# Patient Record
Sex: Female | Born: 2000 | Race: White | Hispanic: No | Marital: Single | Smoking: Never smoker
Health system: Southern US, Community
[De-identification: ages and names within clinical notes are randomized; demographics above are authoritative.]

## PROBLEM LIST (undated history)

## (undated) DIAGNOSIS — E282 Polycystic ovarian syndrome: Secondary | ICD-10-CM

## (undated) DIAGNOSIS — J45909 Unspecified asthma, uncomplicated: Secondary | ICD-10-CM

## (undated) DIAGNOSIS — R05 Cough: Secondary | ICD-10-CM

## (undated) DIAGNOSIS — E119 Type 2 diabetes mellitus without complications: Secondary | ICD-10-CM

## (undated) DIAGNOSIS — E669 Obesity, unspecified: Secondary | ICD-10-CM

## (undated) DIAGNOSIS — E079 Disorder of thyroid, unspecified: Secondary | ICD-10-CM

## (undated) DIAGNOSIS — K760 Fatty (change of) liver, not elsewhere classified: Secondary | ICD-10-CM

## (undated) DIAGNOSIS — E039 Hypothyroidism, unspecified: Secondary | ICD-10-CM

## (undated) DIAGNOSIS — G4730 Sleep apnea, unspecified: Secondary | ICD-10-CM

## (undated) DIAGNOSIS — F419 Anxiety disorder, unspecified: Secondary | ICD-10-CM

## (undated) DIAGNOSIS — R109 Unspecified abdominal pain: Secondary | ICD-10-CM

## (undated) DIAGNOSIS — R059 Cough, unspecified: Secondary | ICD-10-CM

## (undated) HISTORY — PX: OTHER SURGICAL HISTORY: SHX169

## (undated) HISTORY — PX: WISDOM TOOTH EXTRACTION: SHX21

## (undated) HISTORY — PX: UVULOPALATOPHARYNGOPLASTY: SHX827

---
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
## 2019-01-26 ENCOUNTER — Ambulatory Visit: Payer: Medicaid Other

## 2019-01-31 ENCOUNTER — Ambulatory Visit: Payer: Medicaid Other

## 2019-02-12 ENCOUNTER — Encounter: Payer: Self-pay | Admitting: Emergency Medicine

## 2019-02-12 ENCOUNTER — Other Ambulatory Visit: Payer: Self-pay

## 2019-02-12 ENCOUNTER — Emergency Department
Admission: EM | Admit: 2019-02-12 | Discharge: 2019-02-12 | Disposition: A | Discharge status: Home / Self Care | Payer: Medicaid Other | Attending: Emergency Medicine | Admitting: Emergency Medicine

## 2019-02-12 ENCOUNTER — Emergency Department: Payer: Medicaid Other

## 2019-02-12 DIAGNOSIS — E039 Hypothyroidism, unspecified: Secondary | ICD-10-CM | POA: Insufficient documentation

## 2019-02-12 DIAGNOSIS — Z79899 Other long term (current) drug therapy: Secondary | ICD-10-CM | POA: Diagnosis not present

## 2019-02-12 DIAGNOSIS — N938 Other specified abnormal uterine and vaginal bleeding: Secondary | ICD-10-CM | POA: Diagnosis not present

## 2019-02-12 DIAGNOSIS — E119 Type 2 diabetes mellitus without complications: Secondary | ICD-10-CM | POA: Diagnosis not present

## 2019-02-12 DIAGNOSIS — N939 Abnormal uterine and vaginal bleeding, unspecified: Secondary | ICD-10-CM | POA: Diagnosis present

## 2019-02-12 LAB — COMPREHENSIVE METABOLIC PANEL
ALT: 56 U/L — ABNORMAL HIGH (ref 0–44)
AST: 51 U/L — ABNORMAL HIGH (ref 15–41)
Albumin: 4.4 g/dL (ref 3.5–5.0)
Alkaline Phosphatase: 69 U/L (ref 38–126)
Anion gap: 8 (ref 5–15)
BUN: 7 mg/dL (ref 6–20)
CO2: 25 mmol/L (ref 22–32)
Calcium: 9.1 mg/dL (ref 8.9–10.3)
Chloride: 105 mmol/L (ref 98–111)
Creatinine, Ser: 0.53 mg/dL (ref 0.44–1.00)
GFR calc Af Amer: >60 mL/min (ref >60)
GFR calc non Af Amer: >60 mL/min (ref >60)
Glucose, Bld: 99 mg/dL (ref 70–99)
Potassium: 4 mmol/L (ref 3.5–5.1)
Sodium: 138 mmol/L (ref 135–145)
Total Bilirubin: 0.7 mg/dL (ref 0.3–1.2)
Total Protein: 8 g/dL (ref 6.5–8.1)

## 2019-02-12 LAB — TSH: TSH: 7.017 u[IU]/mL — ABNORMAL HIGH (ref 0.350–4.500)

## 2019-02-12 LAB — CBC
HCT: 42.1 % (ref 36.0–46.0)
Hemoglobin: 13.8 g/dL (ref 12.0–15.0)
MCH: 27.7 pg (ref 26.0–34.0)
MCHC: 32.8 g/dL (ref 30.0–36.0)
MCV: 84.4 fL (ref 80.0–100.0)
Platelets: 366 10*3/uL (ref 150–400)
RBC: 4.99 MIL/uL (ref 3.87–5.11)
RDW: 13.3 % (ref 11.5–15.5)
WBC: 10 10*3/uL (ref 4.0–10.5)
nRBC: 0 % (ref 0.0–0.2)

## 2019-02-12 LAB — POCT PREGNANCY, URINE: Preg Test, Ur: NEGATIVE

## 2019-02-12 LAB — PREGNANCY, URINE: Preg Test, Ur: NEGATIVE

## 2019-02-12 LAB — SAMPLE TO BLOOD BANK

## 2019-02-12 NOTE — Discharge Instructions (Addendum)
Please follow-up with your doctor at [facility] or see GYN here.  Dr. Leafy Ro is on for gynecology here.  I have given you her contact information.  Please make sure you are taking very thyroid medication.  Your thyroid level is low and that can predispose you to this heavy bleeding.  Please return if you get weak or lightheaded or begin soaking a pad an hour.

## 2019-02-12 NOTE — ED Notes (Signed)
Patient transported to Ultrasound 

## 2019-02-12 NOTE — ED Provider Notes (Signed)
[facility] Emergency Department Provider Note  ____________________________________________   First MD Initiated Contact with Patient 02/12/19 1715     (approximate)  I have reviewed the triage vital signs and the nursing notes.   HISTORY  Chief Complaint Vaginal Bleeding    HPI Connie Romero is a 18 y.o. female patient reports irregular periods and polycystic ovarian disease.  She has had constant bleeding although low-grade since June 1.  Additionally she reports abdominal pain for about a year.  It was thought it would go away after she had her gallbladder taken out but it really has not.  Patient is supposed to be taking thyroid replacement therapy but stopped.  She just recently got a refill but has not started the thyroid replacement again.  Patient has not seen her doctor for a year.         Past Medical History:  Diagnosis Date   Abdominal pain    Anxiety    Asthma    Cough    Diabetes mellitus without complication (HCC)    Fatty liver    Hypothyroid    Obesity    Sleep apnea    Thyroid disease     There are no active problems to display for this patient.   Past Surgical History:  Procedure Laterality Date   CHOLECYSTECTOMY N/A 10/06/2018   Procedure: LAPAROSCOPIC CHOLECYSTECTOMY;  Surgeon: Carolan Shiverintron-Diaz, Edgardo, MD;  Location: [facility] ORS;  Service: General;  Laterality: N/A;   hypothyroidisn     pcos     UVULOPALATOPHARYNGOPLASTY     WISDOM TOOTH EXTRACTION      Prior to Admission medications   Medication Sig Start Date End Date Taking? Authorizing Provider  levothyroxine (SYNTHROID, LEVOTHROID) 175 MCG tablet Take 175 mcg by mouth daily before breakfast. 04/13/18  Yes [provider]  ondansetron (ZOFRAN ODT) 4 MG disintegrating tablet Take 1 tablet (4 mg total) by mouth every 8 (eight) hours as needed for nausea or vomiting. Patient not taking: Reported on 02/12/2019 09/27/18   Irean HongSung, Jade J, MD     Allergies Shellfish-derived products  No family history on file.  Social History Social History   Tobacco Use   Smoking status: Never Smoker   Smokeless tobacco: Never Used  Substance Use Topics   Alcohol use: No   Drug use: No    Review of systems Constitutional: No fever Eyes: No visual changes. ENT: No sore throat. Cardiovascular: Denies chest pain. Respiratory: Denies shortness of breath. Gastrointestinal: Lower abdominal pain.  No nausea, no vomiting.  No diarrhea.  No constipation. Genitourinary: Negative for dysuria. Musculoskeletal: Negative for back pain. Skin: Negative for rash. Neurological: Negative for headaches, focal weakness   ____________________________________________   PHYSICAL EXAM:  VITAL SIGNS: ED Triage Vitals  Enc Vitals Group     BP 02/12/19 1532 124/69     Pulse Rate 02/12/19 1532 93     Resp 02/12/19 1532 16     Temp 02/12/19 1532 99.4 F (37.4 C)     Temp Source 02/12/19 1532 Oral     SpO2 02/12/19 1532 97 %     Weight 02/12/19 1548 260 lb (117.9 kg)     Height 02/12/19 1548 5\' 5"  (1.651 m)     Head Circumference --      Peak Flow --      Pain Score 02/12/19 1547 0     Pain Loc --      Pain Edu? --      Excl. in  GC? --     Constitutional: Alert and oriented. Well appearing and in no acute distress. Eyes: Conjunctivae are normal.  Head: Atraumatic. Nose: No congestion/rhinnorhea. Mouth/Throat: Mucous membranes are moist.  Oropharynx non-erythematous. Neck: No stridor.   Cardiovascular: Normal rate, regular rhythm. Grossly normal heart sounds.  Good peripheral circulation. Respiratory: Normal respiratory effort.  No retractions. Lungs CTAB. Gastrointestinal: Soft mildly tender in the lower abdomen left lower quadrant.  Patient reports this is been present for about a year.. No distention. No abdominal bruits. No CVA tenderness. Musculoskeletal: No lower extremity tenderness nor edema.  Neurologic:  Normal speech and  language. No gross focal neurologic deficits are appreciated. Skin:  Skin is warm, dry and intact. No rash noted.   ____________________________________________   LABS (all labs ordered are listed, but only abnormal results are displayed)  Labs Reviewed  COMPREHENSIVE METABOLIC PANEL - Abnormal; Notable for the following components:      Result Value   AST 51 (*)    ALT 56 (*)    All other components within normal limits  TSH - Abnormal; Notable for the following components:   TSH 7.017 (*)    All other components within normal limits  CBC  PREGNANCY, URINE  POC URINE PREG, ED  POCT PREGNANCY, URINE  SAMPLE TO BLOOD BANK   ____________________________________________  EKG   ____________________________________________  RADIOLOGY  ED MD interpretation:    Official radiology report(s): US Pelvic Complete With Transvaginal  Result Date: 02/12/2019 CLINICAL DATA:  Vaginal bleeding for the past month and abdominal pain for the past year. EXAM: TRANSABDOMINAL AND TRANSVAGINAL ULTRASOUND OF PELVIS TECHNIQUE: Both transabdominal and transvaginal ultrasound examinations of the pelvis were performed. Transabdominal technique was performed for global imaging of the pelvis including uterus, ovaries, adnexal regions, and pelvic cul-de-sac. It was necessary to proceed with endovaginal exam following the transabdominal exam to visualize the left ovary and better visualize the uterus and right ovary, limited transabdominally by body habitus. COMPARISON:  Abdomen and pelvis CT dated 10/11/2018. FINDINGS: Uterus Measurements: 7.0 x 3.4 x 3.1 cm = volume: 39 mL. No fibroids or other mass visualized. Endometrium Thickness: 6.7 mm.  No focal abnormality visualized. Right ovary Measurements: 3.1 x 2.2 x 1.9 cm = volume: 6.8 mL. Normal appearance/no adnexal mass. Left ovary Measurements: 0.9 x 2.5 x 1.9 cm = volume: 7.3 mL. Normal appearance/no adnexal mass. Other findings No abnormal free fluid.  IMPRESSION: Normal examination. Electronically Signed   By: Claudie Revering M.D.   On: 02/12/2019 20:17    ____________________________________________   PROCEDURES  Procedure(s) performed (including Critical Care):  Procedures   ____________________________________________   INITIAL IMPRESSION / ASSESSMENT AND PLAN / ED COURSE  Radiology has not said there is anything abnormal about her ultrasound.  Patient is hypothyroid and this could easily be causing her low grade chronic bleeding which is not made her anemic.  I will have her follow-up with her doctor at [facility] or GYN here.  Patient was not enthusiastic about my doing a pelvic exam.              ____________________________________________   FINAL CLINICAL IMPRESSION(S) / ED DIAGNOSES  Final diagnoses:  Abnormal uterine bleeding (AUB)     ED Discharge Orders    None       Note:  This document was prepared using Dragon voice recognition software and may include unintentional dictation errors.    Nena Polio, MD 02/12/19 2101

## 2019-02-12 NOTE — ED Triage Notes (Signed)
Pt arrived via POV with reports of irregular vaginal bleeding for several months. Pt states she had 2 periods in June and states she continues to have vaginal bleeding since mid-June.  Pt reports soaking through pads about 2x per day. Pt states last night she bled through pad overnight.  Pt reports some abdominal cramping as well.  Pt not currently on any OCPs or other hormone therapy.

## 2019-02-12 NOTE — ED Notes (Addendum)
Urine sent to lab and type and screen sent to lab with save labels

## 2019-03-08 ENCOUNTER — Ambulatory Visit (LOCAL_COMMUNITY_HEALTH_CENTER): Payer: Self-pay

## 2019-03-08 ENCOUNTER — Other Ambulatory Visit: Payer: Self-pay

## 2019-03-08 VITALS — BP 120/81 | Ht 65.0 in | Wt 267.8 lb

## 2019-03-08 DIAGNOSIS — Z3009 Encounter for other general counseling and advice on contraception: Secondary | ICD-10-CM

## 2019-03-08 DIAGNOSIS — Z30013 Encounter for initial prescription of injectable contraceptive: Secondary | ICD-10-CM

## 2019-03-08 DIAGNOSIS — Z113 Encounter for screening for infections with a predominantly sexual mode of transmission: Secondary | ICD-10-CM

## 2019-03-08 MED ORDER — MEDROXYPROGESTERONE ACETATE 150 MG/ML IM SUSP
150.0000 mg | Freq: Once | INTRAMUSCULAR | Status: AC
  Administered 2019-03-08: 150 mg via INTRAMUSCULAR

## 2019-03-08 NOTE — Progress Notes (Signed)
   Ray problem visit  [facility] Department  Subjective:  Connie Romero is a 18 y.o. being seen today for   Chief Complaint  Patient presents with  . Contraception    Depo  . SEXUALLY TRANSMITTED DISEASE    STD testing    HPI Client states that partner has a disch. States that he has an appt to be evaluated this week.  She denies STD sympts.  She was treated for chlamydia 07/2018.  LMP 03/01/2019.  Last sex was 02/26/2019 without a condom.   Does the patient have a current or past history of drug use? No   No components found for: HCV]   Health Maintenance Due  Topic Date Due  . HIV Screening  11/13/2014  . TETANUS/TDAP  11/13/2018  . INFLUENZA VACCINE  02/26/2019    Review of Systems  All other systems reviewed and are negative.   The following portions of the patient's history were reviewed and updated as appropriate: allergies, current medications, past family history, past medical history, past social history, past surgical history and problem list. Problem list updated.   See flowsheet for other program required questions.  Objective:   Vitals:   03/08/19 1443  BP: 120/81  Weight: 267 lb 12.8 oz (121.5 kg)  Height: 5\' 5"  (1.651 m)    Physical Exam client declines physical exam. Self collect GC/CT and wet prep  Assessment and Plan:  Connie Romero is a 18 y.o. female presenting to the [facility] Department for a Women's Health problem visit  1. Screening examination for venereal disease - WET PREP FOR TRICH, YEAST, CLUE - Chlamydia/Gonorrhea [place] [facility] Treat wet prep as per SO. Wet prep negative    No follow-ups on file.  No future appointments.  Hassell Done, FNP

## 2019-03-08 NOTE — Progress Notes (Signed)
Here today to restart Depo (28.2 weeks post Depo) and STD testing. Hal Morales, RN Wet prep results reviewed. Per standing order no treatment indicated. Depo given and tolerated well. Hal Morales, RN

## 2019-03-09 ENCOUNTER — Ambulatory Visit: Payer: Medicaid Other

## 2019-03-09 LAB — WET PREP FOR TRICH, YEAST, CLUE
Trichomonas Exam: NEGATIVE
Yeast Exam: NEGATIVE

## 2019-03-16 ENCOUNTER — Telehealth: Payer: Self-pay

## 2019-03-16 NOTE — Telephone Encounter (Signed)
TC to patient. Verified ID via password/SS#. Informed of positive Chlaymdia and need for tx. Instructed to eat before visit and have partner call for tx appt. Appt scheduled. Aileen Fass, RN   Lab report in nurse clinic

## 2019-03-17 ENCOUNTER — Ambulatory Visit: Payer: Self-pay

## 2019-03-17 ENCOUNTER — Other Ambulatory Visit: Payer: Self-pay

## 2019-03-17 DIAGNOSIS — A749 Chlamydial infection, unspecified: Secondary | ICD-10-CM

## 2019-03-17 MED ORDER — AZITHROMYCIN 500 MG PO TABS
1000.0000 mg | ORAL_TABLET | Freq: Once | ORAL | Status: AC
  Administered 2019-03-17: 1000 mg via ORAL

## 2019-03-25 ENCOUNTER — Encounter: Payer: Self-pay | Admitting: Emergency Medicine

## 2019-03-25 ENCOUNTER — Emergency Department
Admission: EM | Admit: 2019-03-25 | Discharge: 2019-03-25 | Disposition: A | Discharge status: Home / Self Care | Payer: Medicaid Other | Attending: Emergency Medicine | Admitting: Emergency Medicine

## 2019-03-25 ENCOUNTER — Other Ambulatory Visit: Payer: Self-pay

## 2019-03-25 DIAGNOSIS — Z5321 Procedure and treatment not carried out due to patient leaving prior to being seen by health care provider: Secondary | ICD-10-CM | POA: Diagnosis not present

## 2019-03-25 DIAGNOSIS — R109 Unspecified abdominal pain: Secondary | ICD-10-CM | POA: Insufficient documentation

## 2019-03-25 LAB — COMPREHENSIVE METABOLIC PANEL
ALT: 26 U/L (ref 0–44)
AST: 22 U/L (ref 15–41)
Albumin: 4.3 g/dL (ref 3.5–5.0)
Alkaline Phosphatase: 67 U/L (ref 38–126)
Anion gap: 10 (ref 5–15)
BUN: 8 mg/dL (ref 6–20)
CO2: 23 mmol/L (ref 22–32)
Calcium: 9.1 mg/dL (ref 8.9–10.3)
Chloride: 106 mmol/L (ref 98–111)
Creatinine, Ser: 0.64 mg/dL (ref 0.44–1.00)
GFR calc Af Amer: >60 mL/min (ref >60)
GFR calc non Af Amer: >60 mL/min (ref >60)
Glucose, Bld: 98 mg/dL (ref 70–99)
Potassium: 3.5 mmol/L (ref 3.5–5.1)
Sodium: 139 mmol/L (ref 135–145)
Total Bilirubin: 0.6 mg/dL (ref 0.3–1.2)
Total Protein: 8 g/dL (ref 6.5–8.1)

## 2019-03-25 LAB — URINALYSIS, COMPLETE (UACMP) WITH MICROSCOPIC
Bacteria, UA: NONE SEEN
Bilirubin Urine: NEGATIVE
Glucose, UA: NEGATIVE mg/dL
Hgb urine dipstick: NEGATIVE
Ketones, ur: NEGATIVE mg/dL
Nitrite: NEGATIVE
Protein, ur: 30 mg/dL — AB
Specific Gravity, Urine: 1.028 (ref 1.005–1.030)
pH: 5 (ref 5.0–8.0)

## 2019-03-25 LAB — CBC
HCT: 40.6 % (ref 36.0–46.0)
Hemoglobin: 13.2 g/dL (ref 12.0–15.0)
MCH: 27.5 pg (ref 26.0–34.0)
MCHC: 32.5 g/dL (ref 30.0–36.0)
MCV: 84.6 fL (ref 80.0–100.0)
Platelets: 346 10*3/uL (ref 150–400)
RBC: 4.8 MIL/uL (ref 3.87–5.11)
RDW: 13.2 % (ref 11.5–15.5)
WBC: 11.2 10*3/uL — ABNORMAL HIGH (ref 4.0–10.5)
nRBC: 0 % (ref 0.0–0.2)

## 2019-03-25 LAB — POCT PREGNANCY, URINE: Preg Test, Ur: NEGATIVE

## 2019-03-25 LAB — LIPASE, BLOOD: Lipase: 24 U/L (ref 11–51)

## 2019-03-25 NOTE — ED Triage Notes (Signed)
Patient states generalized abdominal pain for the last few weeks. States pain gets worse when she eats. Patient had cholecystectomy in March and states that she is still having continued pain. States she was told by GI that if she continued to have pain she would need a endoscopy.

## 2019-03-30 NOTE — Addendum Note (Signed)
Addended by: Hassell Done on: 03/30/2019 08:28 AM   Modules accepted: Orders

## 2019-04-18 ENCOUNTER — Emergency Department: Payer: Medicaid Other

## 2019-04-18 ENCOUNTER — Other Ambulatory Visit: Payer: Self-pay

## 2019-04-18 ENCOUNTER — Encounter: Payer: Self-pay | Admitting: *Deleted

## 2019-04-18 ENCOUNTER — Emergency Department
Admission: EM | Admit: 2019-04-18 | Discharge: 2019-04-19 | Disposition: A | Discharge status: Home / Self Care | Payer: Medicaid Other | Attending: Emergency Medicine | Admitting: Emergency Medicine

## 2019-04-18 DIAGNOSIS — E282 Polycystic ovarian syndrome: Secondary | ICD-10-CM

## 2019-04-18 DIAGNOSIS — E039 Hypothyroidism, unspecified: Secondary | ICD-10-CM | POA: Diagnosis not present

## 2019-04-18 DIAGNOSIS — E119 Type 2 diabetes mellitus without complications: Secondary | ICD-10-CM | POA: Diagnosis not present

## 2019-04-18 DIAGNOSIS — R1030 Lower abdominal pain, unspecified: Secondary | ICD-10-CM

## 2019-04-18 DIAGNOSIS — Z79899 Other long term (current) drug therapy: Secondary | ICD-10-CM | POA: Diagnosis not present

## 2019-04-18 DIAGNOSIS — R102 Pelvic and perineal pain: Secondary | ICD-10-CM | POA: Insufficient documentation

## 2019-04-18 DIAGNOSIS — R1032 Left lower quadrant pain: Secondary | ICD-10-CM | POA: Diagnosis present

## 2019-04-18 LAB — URINALYSIS, COMPLETE (UACMP) WITH MICROSCOPIC
Bilirubin Urine: NEGATIVE
Glucose, UA: NEGATIVE mg/dL
Hgb urine dipstick: NEGATIVE
Ketones, ur: NEGATIVE mg/dL
Nitrite: NEGATIVE
Protein, ur: NEGATIVE mg/dL
Specific Gravity, Urine: 1.023 (ref 1.005–1.030)
pH: 5 (ref 5.0–8.0)

## 2019-04-18 LAB — WET PREP, GENITAL
Clue Cells Wet Prep HPF POC: NONE SEEN
Sperm: NONE SEEN
Trich, Wet Prep: NONE SEEN
Yeast Wet Prep HPF POC: NONE SEEN

## 2019-04-18 LAB — CBC
HCT: 42.7 % (ref 36.0–46.0)
Hemoglobin: 13.8 g/dL (ref 12.0–15.0)
MCH: 27.3 pg (ref 26.0–34.0)
MCHC: 32.3 g/dL (ref 30.0–36.0)
MCV: 84.6 fL (ref 80.0–100.0)
Platelets: 353 10*3/uL (ref 150–400)
RBC: 5.05 MIL/uL (ref 3.87–5.11)
RDW: 13.1 % (ref 11.5–15.5)
WBC: 13.6 10*3/uL — ABNORMAL HIGH (ref 4.0–10.5)
nRBC: 0 % (ref 0.0–0.2)

## 2019-04-18 LAB — COMPREHENSIVE METABOLIC PANEL
ALT: 23 U/L (ref 0–44)
AST: 18 U/L (ref 15–41)
Albumin: 4.1 g/dL (ref 3.5–5.0)
Alkaline Phosphatase: 61 U/L (ref 38–126)
Anion gap: 11 (ref 5–15)
BUN: 8 mg/dL (ref 6–20)
CO2: 22 mmol/L (ref 22–32)
Calcium: 9 mg/dL (ref 8.9–10.3)
Chloride: 107 mmol/L (ref 98–111)
Creatinine, Ser: 0.71 mg/dL (ref 0.44–1.00)
GFR calc Af Amer: >60 mL/min (ref >60)
GFR calc non Af Amer: >60 mL/min (ref >60)
Glucose, Bld: 83 mg/dL (ref 70–99)
Potassium: 4 mmol/L (ref 3.5–5.1)
Sodium: 140 mmol/L (ref 135–145)
Total Bilirubin: 0.5 mg/dL (ref 0.3–1.2)
Total Protein: 7.7 g/dL (ref 6.5–8.1)

## 2019-04-18 LAB — POCT PREGNANCY, URINE: Preg Test, Ur: NEGATIVE

## 2019-04-18 LAB — LIPASE, BLOOD: Lipase: 21 U/L (ref 11–51)

## 2019-04-18 MED ORDER — SODIUM CHLORIDE 0.9% FLUSH: 3.0000 mL | Freq: Once | INTRAVENOUS | Status: DC

## 2019-04-18 NOTE — ED Provider Notes (Signed)
-----------------------------------------   11:10 PM on 04/18/2019 -----------------------------------------  Assuming care from Dr. Charna Archer.  In short, Connie Romero is a 18 y.o. female with a chief complaint of RLQ pain.  Refer to the original H&P for additional details.  The current plan of care is to follow up CT abd/pelvis and reassess.  Anticipate discharge if CT does not reveal appendicitis or other emergent cause of abdominal/pelvic pain.   ----------------------------------------- 1:18 AM on 04/19/2019 -----------------------------------------  CT scan reassuring with no evidence of acute or emergent medical condition.  I will update patient and encouraged her to follow-up with OB/GYN for the PCOS found on ultrasound.   Hinda Kehr, MD 04/19/19 [phone]

## 2019-04-18 NOTE — ED Provider Notes (Signed)
[facility] Emergency Department Provider Note  Time seen: 8:36 PM  I have reviewed the triage vital signs and the nursing notes.   HISTORY  Chief Complaint Abdominal Pain   HPI Connie Romero is a 18 y.o. female with a past medical history abdominal pain, anxiety, diabetes, PCO S, presents to the emergency department for lower abdominal pain.   According to the patient beginning this morning she has developed left lower quadrant abdominal pain that is now progressed to the right lower quadrant.  Denies any dysuria or hematuria.  Denies any vaginal bleeding or discharge.  Has never had abdominal surgery per patient.  Denies any fever cough congestion or shortness of breath.  Past Medical History:  Diagnosis Date  . Abdominal pain   . Anxiety   . Asthma   . Cough   . Diabetes mellitus without complication (HCC)   . Fatty liver   . Hypothyroid   . Obesity   . Sleep apnea   . Thyroid disease     There are no active problems to display for this patient.   Past Surgical History:  Procedure Laterality Date  . CHOLECYSTECTOMY N/A 10/06/2018   Procedure: LAPAROSCOPIC CHOLECYSTECTOMY;  Surgeon: Carolan Shiverintron-Diaz, Edgardo, MD;  Location: [facility] ORS;  Service: General;  Laterality: N/A;  . hypothyroidisn    . pcos    . UVULOPALATOPHARYNGOPLASTY    . WISDOM TOOTH EXTRACTION      Prior to Admission medications   Medication Sig Start Date End Date Taking? Authorizing Provider  levothyroxine (SYNTHROID, LEVOTHROID) 175 MCG tablet Take 175 mcg by mouth daily before breakfast. 04/13/18   [provider]  ondansetron (ZOFRAN ODT) 4 MG disintegrating tablet Take 1 tablet (4 mg total) by mouth every 8 (eight) hours as needed for nausea or vomiting. Patient not taking: Reported on 02/12/2019 09/27/18   Irean HongSung, Jade J, MD    Allergies  Allergen Reactions  . Shellfish-Derived Products Nausea And Vomiting    Family History  Problem Relation Age of Onset  . Diabetes  Maternal Grandmother   . Hypertension Maternal Grandmother   . Kidney disease Maternal Grandmother   . Lung cancer Maternal Grandmother   . Pulmonary embolism Maternal Grandmother     Social History Social History   Tobacco Use  . Smoking status: Never Smoker  . Smokeless tobacco: Never Used  Substance Use Topics  . Alcohol use: No  . Drug use: No    Review of Systems Constitutional: Negative for fever. Cardiovascular: Negative for chest pain. Respiratory: Negative for shortness of breath. Gastrointestinal: Lower abdominal pain. Musculoskeletal: Negative for musculoskeletal complaints Neurological: Negative for headache All other ROS negative  ____________________________________________   PHYSICAL EXAM:  VITAL SIGNS: ED Triage Vitals  Enc Vitals Group     BP 04/18/19 1910 118/80     Pulse Rate 04/18/19 1910 69     Resp 04/18/19 1910 20     Temp 04/18/19 1910 99.1 F (37.3 C)     Temp Source 04/18/19 1910 Oral     SpO2 04/18/19 1910 99 %     Weight --      Height --      Head Circumference --      Peak Flow --      Pain Score 04/18/19 1907 9     Pain Loc --      Pain Edu? --      Excl. in GC? --     Constitutional: Alert and oriented. Well appearing and  in no distress. Eyes: Normal exam ENT      Head: Normocephalic and atraumatic      Mouth/Throat: Mucous membranes are moist. Cardiovascular: Normal rate, regular rhythm.  Respiratory: Normal respiratory effort without tachypnea nor retractions. Breath sounds are clear  Gastrointestinal: Soft, moderate lower abdominal pain across the entire lower abdomen, more so in the right lower quadrant.  Pain appears to be more so in the very low right lower quadrant/right pelvis. Musculoskeletal: Nontender with normal range of motion in all extremities. Neurologic:  Normal speech and language. No gross focal neurologic deficits Skin:  Skin is warm, dry and intact.  Psychiatric: Mood and affect are normal.    ____________________________________________    RADIOLOGY  Ultrasound pending  ____________________________________________   INITIAL IMPRESSION / ASSESSMENT AND PLAN / ED COURSE  Pertinent labs & imaging results that were available during my care of the patient were reviewed by me and considered in my medical decision making (see chart for details).   Patient presents emergency department for lower abdominal pain initially left lower quadrant now right lower quadrant.  Differential would include ovarian cyst, hemorrhagic cyst, UTI, less likely appendicitis although this remains on the differential.  Pelvic exam performed by myself shows a mild amount of discharge, no obvious cervicitis or significant tenderness.  We will proceed with an ultrasound to further evaluate.  If ultrasound shows a cause for the discomfort we will treat said because, if ultrasound is negative I would anticipate proceeding with CT imaging of the abdomen to rule out appendicitis.  Patient's work-up does show white blood cell count currently of 13,000, patient has had a history of elevated white blood cell counts in the past as well.  Patient care signed out to oncoming physician.   [name] was evaluated in Emergency Department on 04/18/2019 for the symptoms described in the history of present illness. She was evaluated in the context of the global COVID-19 pandemic, which necessitated consideration that the patient might be at risk for infection with the SARS-CoV-2 virus that causes COVID-19. Institutional protocols and algorithms that pertain to the evaluation of patients at risk for COVID-19 are in a state of rapid change based on information released by regulatory bodies including the CDC and federal and state organizations. These policies and algorithms were followed during the patient's care in the ED.  ____________________________________________   FINAL CLINICAL IMPRESSION(S) / ED DIAGNOSES  Lower  abdominal pain   Harvest Dark, MD 04/18/19 2043

## 2019-04-18 NOTE — ED Notes (Signed)
Patient transported to Ultrasound 

## 2019-04-18 NOTE — ED Triage Notes (Signed)
Pt has left lower abd pain and dysuria.  Sx began today.  Pt also has lower back pain.  No vag discharge.  No vag bleeding. Pt alert  Speech clear.

## 2019-04-18 NOTE — ED Provider Notes (Signed)
-----------------------------------------   9:15 PM on 04/18/2019 -----------------------------------------  Blood pressure 118/80, pulse 69, temperature 99.1 F (37.3 C), temperature source Oral, resp. rate 20, SpO2 99 %.  Assuming care from Dr. Kerman Passey.  In short, Connie Romero is a 18 y.o. female with a chief complaint of Abdominal Pain .  Refer to the original H&P for additional details.  The current plan of care is to follow-up pelvic US results, however if this does not explain patient's symptoms, plan for CT abdomen/pelvis to rule out appendicitis.  Ultrasound shows evidence of PCOS, which could explain patient's symptoms however she states she has never had pain like this before and continues to have right lower quadrant pain.  Will perform CT abdomen/pelvis to assess for appendicitis.  Patient turned over to Dr. Karma Greaser pending results.    Blake Divine, MD 04/18/19 [phone]

## 2019-04-19 ENCOUNTER — Emergency Department: Payer: Medicaid Other

## 2019-04-19 ENCOUNTER — Encounter: Payer: Self-pay | Admitting: Radiology

## 2019-04-19 MED ORDER — IOHEXOL 300 MG/ML  SOLN
125.0000 mL | Freq: Once | INTRAMUSCULAR | Status: AC | PRN
  Administered 2019-04-19: 01:00:00 125 mL via INTRAVENOUS
  Filled 2019-04-19: qty 125

## 2019-04-19 NOTE — Discharge Instructions (Addendum)
Your workup was reassuring without any signs of infection.  Your symptoms are most likely caused by ovarian cysts likely due to a condition called Polycystic Ovarian Syndrome.  Please read through the included information, take over-the-counter ibuprofen and Tylenol according to the label instructions, and follow up with your regular OB/GYN.  Return to the Emergency Department with new or worsening symptoms that concern you.

## 2019-04-19 NOTE — ED Notes (Signed)
Patient transported to CT 

## 2019-04-21 LAB — GC/CHLAMYDIA PROBE AMP
Chlamydia trachomatis, NAA: NEGATIVE
Neisseria Gonorrhoeae by PCR: NEGATIVE

## 2019-05-03 ENCOUNTER — Other Ambulatory Visit: Payer: Self-pay

## 2019-05-03 DIAGNOSIS — Z20822 Contact with and (suspected) exposure to covid-19: Secondary | ICD-10-CM

## 2019-05-05 LAB — NOVEL CORONAVIRUS, NAA: SARS-CoV-2, NAA: NOT DETECTED

## 2019-05-17 ENCOUNTER — Ambulatory Visit: Payer: Self-pay

## 2019-05-23 ENCOUNTER — Other Ambulatory Visit: Payer: Self-pay

## 2019-05-23 DIAGNOSIS — Z20822 Contact with and (suspected) exposure to covid-19: Secondary | ICD-10-CM

## 2019-05-24 ENCOUNTER — Telehealth: Payer: Self-pay

## 2019-05-24 LAB — NOVEL CORONAVIRUS, NAA: SARS-CoV-2, NAA: DETECTED — AB

## 2019-05-24 NOTE — Telephone Encounter (Signed)
Received call from patient checking Covid results.  Advised results positive.  Advised to contact PCP or visit ED if symptoms worsen.

## 2019-06-13 ENCOUNTER — Ambulatory Visit: Payer: Medicaid Other

## 2019-06-22 ENCOUNTER — Ambulatory Visit: Payer: Medicaid Other

## 2019-06-28 ENCOUNTER — Encounter: Payer: Self-pay | Admitting: Family Medicine

## 2019-06-28 ENCOUNTER — Ambulatory Visit (LOCAL_COMMUNITY_HEALTH_CENTER): Payer: Medicaid Other | Admitting: Family Medicine

## 2019-06-28 ENCOUNTER — Other Ambulatory Visit: Payer: Self-pay

## 2019-06-28 VITALS — BP 117/70 | Ht 66.0 in | Wt 276.0 lb

## 2019-06-28 DIAGNOSIS — Z3009 Encounter for other general counseling and advice on contraception: Secondary | ICD-10-CM | POA: Diagnosis not present

## 2019-06-28 DIAGNOSIS — Z30013 Encounter for initial prescription of injectable contraceptive: Secondary | ICD-10-CM

## 2019-06-28 DIAGNOSIS — Z3042 Encounter for surveillance of injectable contraceptive: Secondary | ICD-10-CM

## 2019-06-28 MED ORDER — MEDROXYPROGESTERONE ACETATE 150 MG/ML IM SUSP
150.0000 mg | Freq: Once | INTRAMUSCULAR | Status: AC
  Administered 2019-06-28: 150 mg via INTRAMUSCULAR

## 2019-06-28 NOTE — Progress Notes (Signed)
Pt here for Depo. Pt was here 03/08/2019 and was restarted on Depo. Pt is 16 weeks and 0 days post last Depo. Pt denies any issues with Depo.Ronny Bacon, RN

## 2019-06-28 NOTE — Progress Notes (Signed)
   [place] problem visit  [facility] Department  Subjective:  Connie Romero is a 18 y.o. being seen today for   Chief Complaint  Patient presents with  . Contraception    Depo    HPI  Client is here for her Depo.  States she had Covid-19 and was unable to make her last depo appointment.  She states that she is not sexually active.     Does the patient have a current or past history of drug use? No   No components found for: HCV]   Health Maintenance Due  Topic Date Due  . TETANUS/TDAP  11/13/2018  . INFLUENZA VACCINE  02/26/2019    ROS  The following portions of the patient's history were reviewed and updated as appropriate: allergies, current medications, past family history, past medical history, past social history, past surgical history and problem list. Problem list updated.   See flowsheet for other program required questions.  Objective:   Vitals:   06/28/19 1323  BP: 117/70  Weight: 276 lb (125.2 kg)  Height: 5\' 6"  (1.676 m)    Physical Exam not indicated    Assessment and Plan:  Connie Romero is a 18 y.o. female presenting to the [facility] Department for a Women's Health problem visit  1. General counseling and advice on contraceptive management  2. Surveillance for Depo-Provera contraception  - medroxyPROGESTERone (DEPO-PROVERA) injection 150 mg x 1 Co. Client to make her annual appt when her next Depo is due.  Client states she understands.    No follow-ups on file.  No future appointments.  Hassell Done, FNP

## 2019-06-28 NOTE — Progress Notes (Signed)
Pt received Depo 150mg  IM today per provider order and pt tolerated well. Counseled pt per provider orders and pt states understanding. Provider orders completed.Ronny Bacon, RN

## 2019-07-12 ENCOUNTER — Encounter: Payer: Self-pay | Admitting: *Deleted

## 2019-07-12 ENCOUNTER — Other Ambulatory Visit: Payer: Self-pay

## 2019-07-12 ENCOUNTER — Emergency Department
Admission: EM | Admit: 2019-07-12 | Discharge: 2019-07-13 | Disposition: A | Discharge status: Home / Self Care | Payer: Medicaid Other | Attending: Emergency Medicine | Admitting: Emergency Medicine

## 2019-07-12 ENCOUNTER — Emergency Department: Payer: Medicaid Other

## 2019-07-12 DIAGNOSIS — J45909 Unspecified asthma, uncomplicated: Secondary | ICD-10-CM | POA: Diagnosis not present

## 2019-07-12 DIAGNOSIS — R1310 Dysphagia, unspecified: Secondary | ICD-10-CM | POA: Insufficient documentation

## 2019-07-12 DIAGNOSIS — Z79899 Other long term (current) drug therapy: Secondary | ICD-10-CM | POA: Diagnosis not present

## 2019-07-12 DIAGNOSIS — E119 Type 2 diabetes mellitus without complications: Secondary | ICD-10-CM | POA: Diagnosis not present

## 2019-07-12 DIAGNOSIS — E039 Hypothyroidism, unspecified: Secondary | ICD-10-CM | POA: Insufficient documentation

## 2019-07-12 DIAGNOSIS — R519 Headache, unspecified: Secondary | ICD-10-CM | POA: Diagnosis present

## 2019-07-12 DIAGNOSIS — L03211 Cellulitis of face: Secondary | ICD-10-CM

## 2019-07-12 LAB — CBC WITH DIFFERENTIAL/PLATELET
Abs Immature Granulocytes: 0.05 10*3/uL (ref 0.00–0.07)
Basophils Absolute: 0.1 10*3/uL (ref 0.0–0.1)
Basophils Relative: 1 %
Eosinophils Absolute: 0.2 10*3/uL (ref 0.0–0.5)
Eosinophils Relative: 1 %
HCT: 41.1 % (ref 36.0–46.0)
Hemoglobin: 14 g/dL (ref 12.0–15.0)
Immature Granulocytes: 0 %
Lymphocytes Relative: 32 %
Lymphs Abs: 4.5 10*3/uL — ABNORMAL HIGH (ref 0.7–4.0)
MCH: 27.2 pg (ref 26.0–34.0)
MCHC: 34.1 g/dL (ref 30.0–36.0)
MCV: 80 fL (ref 80.0–100.0)
Monocytes Absolute: 0.8 10*3/uL (ref 0.1–1.0)
Monocytes Relative: 6 %
Neutro Abs: 8.2 10*3/uL — ABNORMAL HIGH (ref 1.7–7.7)
Neutrophils Relative %: 60 %
Platelets: 371 10*3/uL (ref 150–400)
RBC: 5.14 MIL/uL — ABNORMAL HIGH (ref 3.87–5.11)
RDW: 13.6 % (ref 11.5–15.5)
WBC: 13.9 10*3/uL — ABNORMAL HIGH (ref 4.0–10.5)
nRBC: 0 % (ref 0.0–0.2)

## 2019-07-12 LAB — COMPREHENSIVE METABOLIC PANEL
ALT: 26 U/L (ref 0–44)
AST: 17 U/L (ref 15–41)
Albumin: 4.5 g/dL (ref 3.5–5.0)
Alkaline Phosphatase: 73 U/L (ref 38–126)
Anion gap: 9 (ref 5–15)
BUN: 7 mg/dL (ref 6–20)
CO2: 24 mmol/L (ref 22–32)
Calcium: 9.3 mg/dL (ref 8.9–10.3)
Chloride: 104 mmol/L (ref 98–111)
Creatinine, Ser: 0.63 mg/dL (ref 0.44–1.00)
GFR calc Af Amer: >60 mL/min (ref >60)
GFR calc non Af Amer: >60 mL/min (ref >60)
Glucose, Bld: 120 mg/dL — ABNORMAL HIGH (ref 70–99)
Potassium: 3.6 mmol/L (ref 3.5–5.1)
Sodium: 137 mmol/L (ref 135–145)
Total Bilirubin: 0.4 mg/dL (ref 0.3–1.2)
Total Protein: 8.6 g/dL — ABNORMAL HIGH (ref 6.5–8.1)

## 2019-07-12 LAB — POCT PREGNANCY, URINE: Preg Test, Ur: NEGATIVE

## 2019-07-12 LAB — LACTIC ACID, PLASMA: Lactic Acid, Venous: 1 mmol/L (ref 0.5–1.9)

## 2019-07-12 MED ORDER — IOHEXOL 300 MG/ML  SOLN
75.0000 mL | Freq: Once | INTRAMUSCULAR | Status: AC | PRN
  Administered 2019-07-12: 75 mL via INTRAVENOUS

## 2019-07-12 MED ORDER — DEXAMETHASONE SODIUM PHOSPHATE 10 MG/ML IJ SOLN
10.0000 mg | Freq: Once | INTRAMUSCULAR | Status: AC
  Administered 2019-07-12: 10 mg via INTRAVENOUS
  Filled 2019-07-12: qty 1

## 2019-07-12 NOTE — ED Notes (Signed)
Blue top, red top, and one set of blood cultures sent with labs.

## 2019-07-12 NOTE — ED Triage Notes (Addendum)
Pt has chin pain.  Pt reports waking up this am and chin was hurting  No known injury.  No swelling noted.  Denies sore throat.  States it hurts to touch chin. Pt also has left shoulder pain  No known injury.  Sx began this am.  Pt alert  Speech clear.  Positive covid 05/23/19

## 2019-07-12 NOTE — ED Provider Notes (Signed)
Emergency Department Provider Note  ____________________________________________  Time seen: Approximately 10:51 PM  I have reviewed the triage vital signs and the nursing notes.   HISTORY  Chief Complaint Facial Pain   Historian Patient     HPI Connie Romero is a 18 y.o. female with a history of hypothyroidism, diabetes and anxiety, presents to the emergency department with concern for neck swelling. Patient states "my neck doesn't normally look like this."  Patient reports that she has pain underneath her chin that radiates along the anterior aspect of her neck to the left shoulder.  Patient denies pharyngitis but states that she has had some dysphagia.  She has been febrile at home.  No associated rhinorrhea, nasal congestion or nonproductive cough. She denies chest pain and abdominal pain. She states that she has been able to manage her own secretions at home. No other alleviating measures have been attempted.    Past Medical History:  Diagnosis Date  . Abdominal pain   . Anxiety   . Asthma   . Cough   . Diabetes mellitus without complication (HCC)   . Fatty liver   . Hypothyroid   . Obesity   . Sleep apnea   . Thyroid disease      Immunizations up to date:  Yes.     Past Medical History:  Diagnosis Date  . Abdominal pain   . Anxiety   . Asthma   . Cough   . Diabetes mellitus without complication (HCC)   . Fatty liver   . Hypothyroid   . Obesity   . Sleep apnea   . Thyroid disease     There are no problems to display for this patient.   Past Surgical History:  Procedure Laterality Date  . CHOLECYSTECTOMY N/A 10/06/2018   Procedure: LAPAROSCOPIC CHOLECYSTECTOMY;  Surgeon: Carolan Shiver, MD;  Location: [facility] ORS;  Service: General;  Laterality: N/A;  . hypothyroidisn    . pcos    . UVULOPALATOPHARYNGOPLASTY    . WISDOM TOOTH EXTRACTION      Prior to Admission medications   Medication Sig Start Date End Date Taking? Authorizing Provider   levothyroxine (SYNTHROID, LEVOTHROID) 175 MCG tablet Take 175 mcg by mouth daily before breakfast. 04/13/18   [provider]  ondansetron (ZOFRAN ODT) 4 MG disintegrating tablet Take 1 tablet (4 mg total) by mouth every 8 (eight) hours as needed for nausea or vomiting. Patient not taking: Reported on 02/12/2019 09/27/18   Irean Hong, MD    Allergies Shellfish-derived products  Family History  Problem Relation Age of Onset  . Diabetes Maternal Grandmother   . Hypertension Maternal Grandmother   . Kidney disease Maternal Grandmother   . Lung cancer Maternal Grandmother   . Pulmonary embolism Maternal Grandmother     Social History Social History   Tobacco Use  . Smoking status: Never Smoker  . Smokeless tobacco: Never Used  Substance Use Topics  . Alcohol use: No  . Drug use: No     Review of Systems  Constitutional: Patient has fever.  Eyes:  No discharge ENT: Patient has dysphagia Respiratory: no cough. No SOB/ use of accessory muscles to breath Gastrointestinal:   No nausea, no vomiting.  No diarrhea.  No constipation. Musculoskeletal: Negative for musculoskeletal pain. Skin: Negative for rash, abrasions, lacerations, ecchymosis.    ____________________________________________   PHYSICAL EXAM:  VITAL SIGNS: ED Triage Vitals  Enc Vitals Group     BP --      Pulse Rate 07/12/19  2237 79     Resp 07/12/19 2237 18     Temp 07/12/19 2237 100 F (37.8 C)     Temp Source 07/12/19 2237 Oral     SpO2 07/12/19 2237 96 %     Weight 07/12/19 2239 270 lb (122.5 kg)     Height 07/12/19 2239 5\' 6"  (1.676 m)     Head Circumference --      Peak Flow --      Pain Score 07/12/19 2239 9     Pain Loc --      Pain Edu? --      Excl. in GC? --      Constitutional: Alert and oriented. Well appearing and in no acute distress. Eyes: Conjunctivae are normal. PERRL. EOMI. Head: Atraumatic. ENT:      Ears: TMs are pearly.       Nose: No congestion/rhinnorhea.       Mouth/Throat: Mucous membranes are moist.  Posterior pharynx is erythematous. Neck: Full range of motion.  Patient has diffuse tenderness to palpation along anterior neck. Hematological/Lymphatic/Immunilogical: Palpable cervical lymphadenopathy. Cardiovascular: Normal rate, regular rhythm. Normal S1 and S2.  Good peripheral circulation. Respiratory: Normal respiratory effort without tachypnea or retractions. Lungs CTAB. Good air entry to the bases with no decreased or absent breath sounds Musculoskeletal: Full range of motion to all extremities. No obvious deformities noted Neurologic:  Normal for age. No gross focal neurologic deficits are appreciated.  Skin:  Skin is warm, dry and intact. No rash noted. Psychiatric: Mood and affect are normal for age. Speech and behavior are normal.   ____________________________________________   LABS (all labs ordered are listed, but only abnormal results are displayed)  Labs Reviewed  GROUP A STREP BY PCR  CBC WITH DIFFERENTIAL/PLATELET  COMPREHENSIVE METABOLIC PANEL  LACTIC ACID, PLASMA  LACTIC ACID, PLASMA  T3, FREE  T4, FREE  POCT PREGNANCY, URINE  POC URINE PREG, ED   ____________________________________________  EKG   ____________________________________________  RADIOLOGY Geraldo PitterI, Collin Hendley M Jarrod Bodkins, personally viewed and evaluated these images (plain radiographs) as part of my medical decision making, as well as reviewing the written report by the radiologist.  No results found.  ____________________________________________    PROCEDURES  Procedure(s) performed:     Procedures     Medications  dexamethasone (DECADRON) injection 10 mg (10 mg Intravenous Given 07/12/19 2307)     ____________________________________________   INITIAL IMPRESSION / ASSESSMENT AND PLAN / ED COURSE  Pertinent labs & imaging results that were available during my care of the patient were reviewed by me and considered in my medical decision  making (see chart for details).    Assessment and Plan: Neck Swelling 18 year old female presents to the emergency department with new onset neck swelling, dysphagia and fever that started today.  Patient was febrile at triage but vital signs were otherwise reassuring.  On physical exam, patient appears uncomfortable and states that her neck "does not normally look like this".  Patient had diffuse tenderness to palpation from superior aspect of chin to anterior neck.  She feels like pain radiates to the left shoulder.  Differential diagnosis includes Lemierre's, group A strep, infectious thyroiditis, pharyngitis, viral pharyngitis, retropharyngeal abscess...  We will obtain CBC, CMP, lactic acid and CT soft tissue neck.  We will give IV Decadron and will reassess.  Patient care was turned over to attending, Dr. Don PerkingVeronese at [facility] transitioned to close.   ____________________________________________  FINAL CLINICAL IMPRESSION(S) / ED DIAGNOSES  Final diagnoses:  Dysphagia, unspecified  type      NEW MEDICATIONS STARTED DURING THIS VISIT:  ED Discharge Orders    None          This chart was dictated using voice recognition software/Dragon. Despite best efforts to proofread, errors can occur which can change the meaning. Any change was purely unintentional.     Lannie Fields, PA-C 07/12/19 [place], [place], MD 07/13/19 [place]or Curia

## 2019-07-13 LAB — GROUP A STREP BY PCR: Group A Strep by PCR: NOT DETECTED

## 2019-07-13 LAB — T4, FREE: Free T4: 0.66 ng/dL (ref 0.61–1.12)

## 2019-07-13 MED ORDER — CLINDAMYCIN HCL 300 MG PO CAPS: 300.0000 mg | ORAL_CAPSULE | Freq: Three times a day (TID) | ORAL | 0 refills | Status: AC

## 2019-07-13 MED ORDER — CLINDAMYCIN PHOSPHATE 600 MG/50ML IV SOLN
600.0000 mg | Freq: Once | INTRAVENOUS | Status: AC
  Administered 2019-07-13: 600 mg via INTRAVENOUS
  Filled 2019-07-13 (×2): qty 50

## 2019-07-13 NOTE — Discharge Instructions (Addendum)
You have been seen today in the Emergency Department (ED)  for cellulitis, a superficial skin infection. Please take your antibiotics as prescribed for their ENTIRE prescribed duration.    The doctor has checked you carefully, but problems can develop later. If you notice any problems or new symptoms, get medical treatment right away.   Follow-up with your doctor in 24-48 hours for a re-check. If you noticed that redness is expanding, if you develop a fever, or if your symptoms are worsening, if you have difficulty opening your mouth follow up with your doctor immediately or return to the ER as you may need different antibiotic.    When should you call for help?  Call your doctor now or seek immediate medical care if:  You have signs that your infection is getting worse, such as:  Increased pain, swelling, warmth, or redness.  Red streaks leading from the area.  Pus draining from the area.  A fever. You develop a rash  Watch closely for changes in your health, and be sure to contact your doctor if:  You are not getting better after 1 day (24 hours).  You do not get better as expected.  How can you care for yourself at home?  Take your antibiotics as directed. Do not stop taking them just because you feel better. You need to take the full course of antibiotics.  Prop up the infected area on pillows to reduce pain and swelling. Try to keep the area above the level of your heart as often as you can.  Keep the skin clean and dry. Change your bandages as your doctor recommended.  Take pain medicines exactly as directed.  If the doctor gave you a prescription medicine for pain, take it as prescribed.  If you are not taking a prescription pain medicine, ask your doctor if you can take an over-the-counter medicine.  To prevent cellulitis in the future  Try to prevent cuts, scrapes, or other injuries to your skin. Cellulitis most often occurs where there is a break in the skin.  If you get a  scrape, cut, mild burn, or bite, clean the wound with soap and water as soon as you can to help avoid infection. Don't use hydrogen peroxide or alcohol, which can slow healing.  You may cover the wound with a thin layer of antibiotic ointment, such as bacitracin, and a nonstick bandage.  Apply more ointment and replace the bandage as needed. If you have swelling in your legs (edema), support stockings and good skin care may help prevent leg sores and cellulitis.  Take care of your feet, especially if you have diabetes or other conditions that increase the risk of infection. Wear shoes and socks. Do not go barefoot. If you have athlete's foot or other skin problems on your feet, talk to your doctor about how to treat them.

## 2019-07-14 LAB — T3, FREE: T3, Free: 3.9 pg/mL (ref 2.3–5.0)

## 2019-09-12 ENCOUNTER — Other Ambulatory Visit: Payer: Self-pay | Admitting: Physician Assistant

## 2019-09-12 DIAGNOSIS — Z3042 Encounter for surveillance of injectable contraceptive: Secondary | ICD-10-CM

## 2019-09-12 MED ORDER — MEDROXYPROGESTERONE ACETATE 150 MG/ML IM SUSP: 150.0000 mg | INTRAMUSCULAR | Status: AC

## 2019-09-12 NOTE — Progress Notes (Signed)
Per chart, last RP 05/2018.  Signed abnormal and FP consents 06/2019.  CBE due 2021 and pap due 2022.  Provided that patient desires to continue with Depo and BP is normal, ok to continue with Depo.

## 2019-09-13 ENCOUNTER — Ambulatory Visit: Payer: Medicaid Other

## 2019-09-22 ENCOUNTER — Ambulatory Visit: Payer: Medicaid Other

## 2019-10-06 ENCOUNTER — Ambulatory Visit: Payer: Medicaid Other

## 2019-10-07 ENCOUNTER — Other Ambulatory Visit: Payer: Self-pay

## 2019-10-07 ENCOUNTER — Emergency Department
Admission: EM | Admit: 2019-10-07 | Discharge: 2019-10-07 | Disposition: A | Discharge status: Home / Self Care | Payer: Medicaid Other | Attending: Emergency Medicine | Admitting: Emergency Medicine

## 2019-10-07 DIAGNOSIS — E039 Hypothyroidism, unspecified: Secondary | ICD-10-CM | POA: Insufficient documentation

## 2019-10-07 DIAGNOSIS — Z79899 Other long term (current) drug therapy: Secondary | ICD-10-CM | POA: Diagnosis not present

## 2019-10-07 DIAGNOSIS — E119 Type 2 diabetes mellitus without complications: Secondary | ICD-10-CM | POA: Diagnosis not present

## 2019-10-07 DIAGNOSIS — G5602 Carpal tunnel syndrome, left upper limb: Secondary | ICD-10-CM | POA: Diagnosis not present

## 2019-10-07 DIAGNOSIS — Z9049 Acquired absence of other specified parts of digestive tract: Secondary | ICD-10-CM | POA: Diagnosis not present

## 2019-10-07 DIAGNOSIS — M25532 Pain in left wrist: Secondary | ICD-10-CM | POA: Diagnosis present

## 2019-10-07 DIAGNOSIS — J45909 Unspecified asthma, uncomplicated: Secondary | ICD-10-CM | POA: Insufficient documentation

## 2019-10-07 MED ORDER — MELOXICAM 15 MG PO TABS: 15.0000 mg | ORAL_TABLET | Freq: Every day | ORAL | 0 refills | Status: DC

## 2019-10-07 NOTE — ED Triage Notes (Signed)
Patient reports left hand/wrist pain for over 1 month.

## 2019-10-07 NOTE — ED Provider Notes (Signed)
[facility] Emergency Department Provider Note  ____________________________________________  Time seen: Approximately 11:10 PM  I have reviewed the triage vital signs and the nursing notes.   HISTORY  Chief Complaint Wrist Pain    HPI Connie Romero is a 19 y.o. female who presents the emergency department complaining of pain in the anterior left wrist extending into her fingers.  Patient states that she will have intermittent numbness and tingling as well as pain rating from the wrist into the thumb, index and middle finger.  She states that tonight she was at work, was trying to do her job however the symptoms were keeping her from having a tight grip on anything with the left hand.  No direct trauma.  Patient denies any elbow pain.  She denies any erythema or edema.  Symptoms have been progressing x1 month.  No history of same.  Patient has not tried any alleviating factors at home.  Patient has a medical history as described below but denies any complaints of chronic medical problems.         Past Medical History:  Diagnosis Date  . Abdominal pain   . Anxiety   . Asthma   . Cough   . Diabetes mellitus without complication (HCC)   . Fatty liver   . Hypothyroid   . Obesity   . Sleep apnea   . Thyroid disease     There are no problems to display for this patient.   Past Surgical History:  Procedure Laterality Date  . CHOLECYSTECTOMY N/A 10/06/2018   Procedure: LAPAROSCOPIC CHOLECYSTECTOMY;  Surgeon: Carolan Shiver, MD;  Location: [facility] ORS;  Service: General;  Laterality: N/A;  . hypothyroidisn    . pcos    . UVULOPALATOPHARYNGOPLASTY    . WISDOM TOOTH EXTRACTION      Prior to Admission medications   Medication Sig Start Date End Date Taking? Authorizing Provider  levothyroxine (SYNTHROID, LEVOTHROID) 175 MCG tablet Take 175 mcg by mouth daily before breakfast. 04/13/18   [provider]  meloxicam (MOBIC) 15 MG tablet Take 1  tablet (15 mg total) by mouth daily. 10/07/19   Brandun Pinn, Delorise Royals, PA-C  ondansetron (ZOFRAN ODT) 4 MG disintegrating tablet Take 1 tablet (4 mg total) by mouth every 8 (eight) hours as needed for nausea or vomiting. Patient not taking: Reported on 02/12/2019 09/27/18   Irean Hong, MD    Allergies Shellfish-derived products  Family History  Problem Relation Age of Onset  . Diabetes Maternal Grandmother   . Hypertension Maternal Grandmother   . Kidney disease Maternal Grandmother   . Lung cancer Maternal Grandmother   . Pulmonary embolism Maternal Grandmother     Social History Social History   Tobacco Use  . Smoking status: Never Smoker  . Smokeless tobacco: Never Used  Substance Use Topics  . Alcohol use: No  . Drug use: No     Review of Systems  Constitutional: No fever/chills Eyes: No visual changes. No discharge ENT: No upper respiratory complaints. Cardiovascular: no chest pain. Respiratory: no cough. No SOB. Gastrointestinal: No abdominal pain.  No nausea, no vomiting.  No diarrhea.  No constipation. Musculoskeletal: Positive for left wrist and hand symptoms, described as pain, numbness and tingling Skin: Negative for rash, abrasions, lacerations, ecchymosis. Neurological: Negative for headaches, focal weakness or numbness. 10-point ROS otherwise negative.  ____________________________________________   PHYSICAL EXAM:  VITAL SIGNS: ED Triage Vitals  Enc Vitals Group     BP 10/07/19 2131 139/81  Pulse Rate 10/07/19 2131 87     Resp 10/07/19 2131 16     Temp 10/07/19 2131 98.4 F (36.9 C)     Temp Source 10/07/19 2131 Oral     SpO2 10/07/19 2131 97 %     Weight 10/07/19 2128 270 lb (122.5 kg)     Height 10/07/19 2128 5\' 5"  (1.651 m)     Head Circumference --      Peak Flow --      Pain Score 10/07/19 2128 9     Pain Loc --      Pain Edu? --      Excl. in [place]? --      Constitutional: Alert and oriented. Well appearing and in no acute  distress. Eyes: Conjunctivae are normal. PERRL. EOMI. Head: Atraumatic. ENT:      Ears:       Nose: No congestion/rhinnorhea.      Mouth/Throat: Mucous membranes are moist.  Neck: No stridor.    Cardiovascular: Normal rate, regular rhythm. Normal S1 and S2.  Good peripheral circulation. Respiratory: Normal respiratory effort without tachypnea or retractions. Lungs CTAB. Good air entry to the bases with no decreased or absent breath sounds. Musculoskeletal: Full range of motion to all extremities. No gross deformities appreciated.  Examination of the left wrist and hand reveals no visible abnormality.  No erythema, edema.  No evidence of trauma.  No tenderness to palpation over the wrist or hand.  Full range of motion all digits.  Sensation intact all digits.  Capillary refill less than 2 seconds all digits.  Patient does have a positive Tinel's and Phalen's.  Negative Finkelstein's.  Examination of the elbow is unremarkable. Neurologic:  Normal speech and language. No gross focal neurologic deficits are appreciated.  Skin:  Skin is warm, dry and intact. No rash noted. Psychiatric: Mood and affect are normal. Speech and behavior are normal. Patient exhibits appropriate insight and judgement.   ____________________________________________   LABS (all labs ordered are listed, but only abnormal results are displayed)  Labs Reviewed - No data to display ____________________________________________  EKG   ____________________________________________  RADIOLOGY   No results found.  ____________________________________________    PROCEDURES  Procedure(s) performed:    .Splint Application  Date/Time: 10/07/2019 11:13 PM Performed by: Darletta Moll, PA-C Authorized by: Darletta Moll, PA-C   Consent:    Consent obtained:  Verbal   Consent given by:  Patient   Risks discussed:  Pain Pre-procedure details:    Sensation:  Normal Procedure details:     Laterality:  Left   Location:  Wrist   Wrist:  L wrist   Splint type:  Wrist   Supplies:  Prefabricated splint Post-procedure details:    Pain:  Unchanged   Sensation:  Normal   Patient tolerance of procedure:  Tolerated well, no immediate complications      Medications - No data to display   ____________________________________________   INITIAL IMPRESSION / ASSESSMENT AND PLAN / ED COURSE  Pertinent labs & imaging results that were available during my care of the patient were reviewed by me and considered in my medical decision making (see chart for details).  Review of the [place] CSRS was performed in accordance of the [place] prior to dispensing any controlled drugs.           Patient's diagnosis is consistent with carpal tunnel.  Patient presented to the emergency department complaining of wrist and hand symptoms.  Exam is reassuring and findings are  consistent with carpal tunnel.  No evidence for imaging or labs at this time.  Patient is given Velcro wrist splint here in the emergency department.  Patient will be prescribed meloxicam.  Follow-up with hand surgery if symptoms do not improve..  Patient is given ED precautions to return to the ED for any worsening or new symptoms.     ____________________________________________  FINAL CLINICAL IMPRESSION(S) / ED DIAGNOSES  Final diagnoses:  Carpal tunnel syndrome of left wrist      NEW MEDICATIONS STARTED DURING THIS VISIT:  ED Discharge Orders         Ordered    meloxicam (MOBIC) 15 MG tablet  Daily     10/07/19 2314              This chart was dictated using voice recognition software/Dragon. Despite best efforts to proofread, errors can occur which can change the meaning. Any change was purely unintentional.    Racheal Patches, PA-C 10/07/19 2315    Sharyn Creamer, MD 10/07/19 [phone]

## 2019-10-07 NOTE — ED Notes (Signed)
Pt reports she has had left hand pain and weakness for about a month.  She states she cannot grip or lift anything with that hand; pt is unable to make a fist.  Pt rates the pain 9/10; states Tylenol does not help.

## 2019-10-14 ENCOUNTER — Ambulatory Visit: Payer: Medicaid Other

## 2019-10-23 ENCOUNTER — Encounter: Payer: Self-pay | Admitting: Emergency Medicine

## 2019-10-23 ENCOUNTER — Other Ambulatory Visit: Payer: Self-pay

## 2019-10-23 ENCOUNTER — Emergency Department
Admission: EM | Admit: 2019-10-23 | Discharge: 2019-10-23 | Disposition: A | Discharge status: Home / Self Care | Payer: Medicaid Other | Attending: Emergency Medicine | Admitting: Emergency Medicine

## 2019-10-23 ENCOUNTER — Emergency Department: Payer: Medicaid Other

## 2019-10-23 DIAGNOSIS — E119 Type 2 diabetes mellitus without complications: Secondary | ICD-10-CM | POA: Diagnosis not present

## 2019-10-23 DIAGNOSIS — Z79899 Other long term (current) drug therapy: Secondary | ICD-10-CM | POA: Diagnosis not present

## 2019-10-23 DIAGNOSIS — R1031 Right lower quadrant pain: Secondary | ICD-10-CM | POA: Diagnosis not present

## 2019-10-23 DIAGNOSIS — R109 Unspecified abdominal pain: Secondary | ICD-10-CM | POA: Diagnosis present

## 2019-10-23 LAB — COMPREHENSIVE METABOLIC PANEL
ALT: 46 U/L — ABNORMAL HIGH (ref 0–44)
AST: 31 U/L (ref 15–41)
Albumin: 4.3 g/dL (ref 3.5–5.0)
Alkaline Phosphatase: 77 U/L (ref 38–126)
Anion gap: 10 (ref 5–15)
BUN: 8 mg/dL (ref 6–20)
CO2: 26 mmol/L (ref 22–32)
Calcium: 9 mg/dL (ref 8.9–10.3)
Chloride: 104 mmol/L (ref 98–111)
Creatinine, Ser: 0.56 mg/dL (ref 0.44–1.00)
GFR calc Af Amer: >60 mL/min (ref >60)
GFR calc non Af Amer: >60 mL/min (ref >60)
Glucose, Bld: 102 mg/dL — ABNORMAL HIGH (ref 70–99)
Potassium: 3.6 mmol/L (ref 3.5–5.1)
Sodium: 140 mmol/L (ref 135–145)
Total Bilirubin: 0.4 mg/dL (ref 0.3–1.2)
Total Protein: 8 g/dL (ref 6.5–8.1)

## 2019-10-23 LAB — CBC
HCT: 42.6 % (ref 36.0–46.0)
Hemoglobin: 13.7 g/dL (ref 12.0–15.0)
MCH: 27.8 pg (ref 26.0–34.0)
MCHC: 32.2 g/dL (ref 30.0–36.0)
MCV: 86.6 fL (ref 80.0–100.0)
Platelets: 342 10*3/uL (ref 150–400)
RBC: 4.92 MIL/uL (ref 3.87–5.11)
RDW: 13.2 % (ref 11.5–15.5)
WBC: 11.9 10*3/uL — ABNORMAL HIGH (ref 4.0–10.5)
nRBC: 0 % (ref 0.0–0.2)

## 2019-10-23 LAB — URINALYSIS, COMPLETE (UACMP) WITH MICROSCOPIC
Bacteria, UA: NONE SEEN
Bilirubin Urine: NEGATIVE
Glucose, UA: NEGATIVE mg/dL
Hgb urine dipstick: NEGATIVE
Ketones, ur: NEGATIVE mg/dL
Nitrite: NEGATIVE
Protein, ur: NEGATIVE mg/dL
Specific Gravity, Urine: 1.026 (ref 1.005–1.030)
pH: 5 (ref 5.0–8.0)

## 2019-10-23 LAB — POCT PREGNANCY, URINE: Preg Test, Ur: NEGATIVE

## 2019-10-23 LAB — LIPASE, BLOOD: Lipase: 22 U/L (ref 11–51)

## 2019-10-23 MED ORDER — KETOROLAC TROMETHAMINE 30 MG/ML IJ SOLN
30.0000 mg | Freq: Once | INTRAMUSCULAR | Status: AC
  Administered 2019-10-23: 30 mg via INTRAVENOUS
  Filled 2019-10-23: qty 1

## 2019-10-23 MED ORDER — IOHEXOL 300 MG/ML  SOLN
125.0000 mL | Freq: Once | INTRAMUSCULAR | Status: AC | PRN
  Administered 2019-10-23: 125 mL via INTRAVENOUS

## 2019-10-23 MED ORDER — ONDANSETRON HCL 4 MG/2ML IJ SOLN
4.0000 mg | Freq: Once | INTRAMUSCULAR | Status: AC
  Administered 2019-10-23: 4 mg via INTRAVENOUS
  Filled 2019-10-23: qty 2

## 2019-10-23 NOTE — ED Provider Notes (Signed)
[facility] Emergency Department Provider Note   ____________________________________________    I have reviewed the triage vital signs and the nursing notes.   HISTORY  Chief Complaint Abdominal Pain     HPI Connie Romero is a 19 y.o. female with a history as noted below who presents with complaints of right lower quadrant abdominal pain.  Patient reports that started yesterday but worsened today.  She reports the pain is mild in intensity and does not radiate.  She has had this once before and it went away on her own.  She denies vaginal bleeding or vaginal discharge.  No fevers or chills.  Mild nausea.  No diarrhea.  Has not take anything for this.  Past Medical History:  Diagnosis Date  . Abdominal pain   . Anxiety   . Asthma   . Cough   . Diabetes mellitus without complication ([place])   . Fatty liver   . Hypothyroid   . Obesity   . Sleep apnea   . Thyroid disease     There are no problems to display for this patient.   Past Surgical History:  Procedure Laterality Date  . CHOLECYSTECTOMY N/A 10/06/2018   Procedure: LAPAROSCOPIC CHOLECYSTECTOMY;  Surgeon: Herbert Pun, MD;  Location: [facility] ORS;  Service: General;  Laterality: N/A;  . hypothyroidisn    . pcos    . UVULOPALATOPHARYNGOPLASTY    . WISDOM TOOTH EXTRACTION      Prior to Admission medications   Medication Sig Start Date End Date Taking? Authorizing Provider  levothyroxine (SYNTHROID, LEVOTHROID) 175 MCG tablet Take 175 mcg by mouth daily before breakfast. 04/13/18   [provider]  meloxicam (MOBIC) 15 MG tablet Take 1 tablet (15 mg total) by mouth daily. 10/07/19   Cuthriell, Charline Bills, PA-C  ondansetron (ZOFRAN ODT) 4 MG disintegrating tablet Take 1 tablet (4 mg total) by mouth every 8 (eight) hours as needed for nausea or vomiting. Patient not taking: Reported on 02/12/2019 09/27/18   Paulette Blanch, MD     Allergies Shellfish-derived products  Family  History  Problem Relation Age of Onset  . Diabetes Maternal Grandmother   . Hypertension Maternal Grandmother   . Kidney disease Maternal Grandmother   . Lung cancer Maternal Grandmother   . Pulmonary embolism Maternal Grandmother     Social History Social History   Tobacco Use  . Smoking status: Never Smoker  . Smokeless tobacco: Never Used  Substance Use Topics  . Alcohol use: No  . Drug use: No    Review of Systems  Constitutional: No fever/chills Eyes: No visual changes.  ENT: No sore throat. Cardiovascular: Denies chest pain. Respiratory: Denies shortness of breath. Gastrointestinal: As above Genitourinary: Negative for dysuria. Musculoskeletal: Negative for back pain. Skin: Negative for rash. Neurological: Negative for headaches   ____________________________________________   PHYSICAL EXAM:  VITAL SIGNS: ED Triage Vitals  Enc Vitals Group     BP 10/23/19 2117 126/76     Pulse Rate 10/23/19 2117 83     Resp 10/23/19 2117 18     Temp 10/23/19 2117 98.7 F (37.1 C)     Temp Source 10/23/19 2117 Oral     SpO2 10/23/19 2117 98 %     Weight 10/23/19 2118 117.9 kg (260 lb)     Height 10/23/19 2118 1.651 m (5\' 5" )     Head Circumference --      Peak Flow --      Pain Score 10/23/19 2117  9     Pain Loc --      Pain Edu? --      Excl. in [place]? --     Constitutional: Alert and oriented.  Nose: No congestion/rhinnorhea. Mouth/Throat: Mucous membranes are moist.    Cardiovascular: Normal rate, regular rhythm. Grossly normal heart sounds.  Good peripheral circulation. Respiratory: Normal respiratory effort.  No retractions. Gastrointestinal: Tenderness palpation of the right lower quadrant, no distention, no CVA tenderness  Musculoskeletal: No lower extremity tenderness nor edema.  Warm and well perfused Neurologic:  Normal speech and language. No gross focal neurologic deficits are appreciated.  Skin:  Skin is warm, dry and intact. No rash  noted. Psychiatric: Mood and affect are normal. Speech and behavior are normal.  ____________________________________________   LABS (all labs ordered are listed, but only abnormal results are displayed)  Labs Reviewed  COMPREHENSIVE METABOLIC PANEL - Abnormal; Notable for the following components:      Result Value   Glucose, Bld 102 (*)    ALT 46 (*)    All other components within normal limits  CBC - Abnormal; Notable for the following components:   WBC 11.9 (*)    All other components within normal limits  URINALYSIS, COMPLETE (UACMP) WITH MICROSCOPIC - Abnormal; Notable for the following components:   Color, Urine YELLOW (*)    APPearance HAZY (*)    Leukocytes,Ua TRACE (*)    All other components within normal limits  LIPASE, BLOOD  POC URINE PREG, ED  POCT PREGNANCY, URINE   ____________________________________________  EKG  No ____________________________________________  RADIOLOGY  CT abdomen pelvis demonstrates normal appendix, no other abnormalities on the CT ____________________________________________   PROCEDURES  Procedure(s) performed: No  Procedures   Critical Care performed: No ____________________________________________   INITIAL IMPRESSION / ASSESSMENT AND PLAN / ED COURSE  Pertinent labs & imaging results that were available during my care of the patient were reviewed by me and considered in my medical decision making (see chart for details).  Patient overall well-appearing in no acute distress, mild tenderness palpation the right lower quadrant, differential includes appendicitis, urinary tract infection, kidney stone.  Will treat with IV Toradol, IV Zofran, obtain CT abdomen pelvis and reevaluate.  Lab work is overall reassuring, CBC demonstrates mild leukocytosis  CT scan is reassuring, appendix appears normal.  Patient is feeling much better after treatment, appropriate for discharge at this time with outpatient follow-up.  Return  precautions discussed.    ____________________________________________   FINAL CLINICAL IMPRESSION(S) / ED DIAGNOSES  Final diagnoses:  Right lower quadrant abdominal pain        Note:  This document was prepared using Dragon voice recognition software and may include unintentional dictation errors.   Jene Every, MD 10/23/19 2330

## 2019-10-23 NOTE — ED Triage Notes (Signed)
Pt arrives ambulatory to triage with c/o RLQ pain which started yesterday and increased today while working. Pt is in NAD.

## 2019-11-02 ENCOUNTER — Ambulatory Visit: Payer: Medicaid Other

## 2019-11-08 ENCOUNTER — Encounter: Payer: Medicaid Other | Admitting: Obstetrics and Gynecology

## 2019-11-30 ENCOUNTER — Ambulatory Visit: Payer: Medicaid Other

## 2019-12-21 ENCOUNTER — Emergency Department
Admission: EM | Admit: 2019-12-21 | Discharge: 2019-12-21 | Disposition: A | Discharge status: Home / Self Care | Payer: Medicaid Other | Attending: Emergency Medicine | Admitting: Emergency Medicine

## 2019-12-21 ENCOUNTER — Other Ambulatory Visit: Payer: Self-pay

## 2019-12-21 DIAGNOSIS — R05 Cough: Secondary | ICD-10-CM | POA: Diagnosis not present

## 2019-12-21 DIAGNOSIS — N390 Urinary tract infection, site not specified: Secondary | ICD-10-CM

## 2019-12-21 DIAGNOSIS — E119 Type 2 diabetes mellitus without complications: Secondary | ICD-10-CM | POA: Insufficient documentation

## 2019-12-21 DIAGNOSIS — J45909 Unspecified asthma, uncomplicated: Secondary | ICD-10-CM | POA: Insufficient documentation

## 2019-12-21 DIAGNOSIS — E039 Hypothyroidism, unspecified: Secondary | ICD-10-CM | POA: Insufficient documentation

## 2019-12-21 DIAGNOSIS — R300 Dysuria: Secondary | ICD-10-CM | POA: Diagnosis present

## 2019-12-21 LAB — URINALYSIS, COMPLETE (UACMP) WITH MICROSCOPIC
Bacteria, UA: NONE SEEN
Bilirubin Urine: NEGATIVE
Glucose, UA: NEGATIVE mg/dL
Ketones, ur: NEGATIVE mg/dL
Nitrite: NEGATIVE
Protein, ur: 30 mg/dL — AB
Specific Gravity, Urine: 1.019 (ref 1.005–1.030)
WBC, UA: >50 WBC/hpf — ABNORMAL HIGH (ref 0–5)
pH: 5 (ref 5.0–8.0)

## 2019-12-21 LAB — POCT PREGNANCY, URINE: Preg Test, Ur: NEGATIVE

## 2019-12-21 MED ORDER — CEPHALEXIN 500 MG PO CAPS: 500.0000 mg | ORAL_CAPSULE | Freq: Three times a day (TID) | ORAL | 0 refills | Status: DC

## 2019-12-21 NOTE — Discharge Instructions (Signed)
Follow-up with your regular doctor if not improving in 3 days.  Return emergency department worsening.  Take medication as prescribed 

## 2019-12-21 NOTE — ED Provider Notes (Signed)
[facility] Emergency Department Provider Note  ____________________________________________   First MD Initiated Contact with Patient 12/21/19 1445     (approximate)  I have reviewed the triage vital signs and the nursing notes.   HISTORY  Chief Complaint UTI    HPI Connie Romero is a 19 y.o. female presents emergency department complaint burning with urination.  No vaginal discharge.  Patient states she is not concerned about STD.  No fever or chills.  Patient is diabetic and takes Metformin.   Pain is 7 out of 10   Past Medical History:  Diagnosis Date  . Abdominal pain   . Anxiety   . Asthma   . Cough   . Diabetes mellitus without complication (HCC)   . Fatty liver   . Hypothyroid   . Obesity   . Sleep apnea   . Thyroid disease     There are no problems to display for this patient.   Past Surgical History:  Procedure Laterality Date  . CHOLECYSTECTOMY N/A 10/06/2018   Procedure: LAPAROSCOPIC CHOLECYSTECTOMY;  Surgeon: Carolan Shiver, MD;  Location: [facility] ORS;  Service: General;  Laterality: N/A;  . hypothyroidisn    . pcos    . UVULOPALATOPHARYNGOPLASTY    . WISDOM TOOTH EXTRACTION      Prior to Admission medications   Medication Sig Start Date End Date Taking? Authorizing Provider  cephALEXin (KEFLEX) 500 MG capsule Take 1 capsule (500 mg total) by mouth 3 (three) times daily. 12/21/19   Sherrie Mustache Roselyn Bering, PA-C  levothyroxine (SYNTHROID, LEVOTHROID) 175 MCG tablet Take 175 mcg by mouth daily before breakfast. 04/13/18   [provider]    Allergies Shellfish-derived products  Family History  Problem Relation Age of Onset  . Diabetes Maternal Grandmother   . Hypertension Maternal Grandmother   . Kidney disease Maternal Grandmother   . Lung cancer Maternal Grandmother   . Pulmonary embolism Maternal Grandmother     Social History Social History   Tobacco Use  . Smoking status: Never Smoker  . Smokeless  tobacco: Never Used  Substance Use Topics  . Alcohol use: No  . Drug use: No    Review of Systems  Constitutional: No fever/chills Eyes: No visual changes. ENT: No sore throat. Respiratory: Denies cough Cardiovascular: Denies chest pain Gastrointestinal: Denies abdominal pain Genitourinary: Positive for dysuria. Musculoskeletal: Negative for back pain. Skin: Negative for rash. Psychiatric: no mood changes,     ____________________________________________   PHYSICAL EXAM:  VITAL SIGNS: ED Triage Vitals [12/21/19 1353]  Enc Vitals Group     BP 118/84     Pulse Rate 67     Resp 18     Temp 98.1 F (36.7 C)     Temp src      SpO2 100 %     Weight 250 lb (113.4 kg)     Height 5\' 5"  (1.651 m)     Head Circumference      Peak Flow      Pain Score 7     Pain Loc      Pain Edu?      Excl. in [place]?     Constitutional: Alert and oriented. Well appearing and in no acute distress. Eyes: Conjunctivae are normal.  Head: Atraumatic. Nose: No congestion/rhinnorhea. Mouth/Throat: Mucous membranes are moist.   Neck:  supple no lymphadenopathy noted Cardiovascular: Normal rate, regular rhythm. Heart sounds are normal Respiratory: Normal respiratory effort.  No retractions, lungs c t a  Abd: soft  nontender bs normal all 4 quad GU: deferred by the patient Musculoskeletal: FROM all extremities, warm and well perfused Neurologic:  Normal speech and language.  Skin:  Skin is warm, dry and intact. No rash noted. Psychiatric: Mood and affect are normal. Speech and behavior are normal.  ____________________________________________   LABS (all labs ordered are listed, but only abnormal results are displayed)  Labs Reviewed  URINALYSIS, COMPLETE (UACMP) WITH MICROSCOPIC - Abnormal; Notable for the following components:      Result Value   Color, Urine YELLOW (*)    APPearance CLOUDY (*)    Hgb urine dipstick MODERATE (*)    Protein, ur 30 (*)    Leukocytes,Ua LARGE (*)     WBC, UA >50 (*)    All other components within normal limits  URINE CULTURE  POC URINE PREG, ED  POCT PREGNANCY, URINE   ____________________________________________   ____________________________________________  RADIOLOGY    ____________________________________________   PROCEDURES  Procedure(s) performed: No  Procedures    ____________________________________________   INITIAL IMPRESSION / ASSESSMENT AND PLAN / ED COURSE  Pertinent labs & imaging results that were available during my care of the patient were reviewed by me and considered in my medical decision making (see chart for details).   Patient is 19 year old female presents emergency department for UTI symptoms.  Physical exam is basically unremarkable.  Patient is refusing pelvic exam at this time.  UA shows moderate hemoglobin, 30 protein, large amount of leuks, greater than 50 WBCs, POC pregnancy is negative.  Explained findings to the patient.  Started her on a prescription for Keflex.  I did order a urine culture as there was no bacteria noted in the UA.  She is to follow-up with her regular doctor if not improving in 3 to 4 days.  Return emergency department for worsening.  Asked her once again if she is sure she does not need to be screened for STDs and she said yes.  She was discharged stable condition.    Connie Romero was evaluated in Emergency Department on 12/21/2019 for the symptoms described in the history of present illness. She was evaluated in the context of the global COVID-19 pandemic, which necessitated consideration that the patient might be at risk for infection with the SARS-CoV-2 virus that causes COVID-19. Institutional protocols and algorithms that pertain to the evaluation of patients at risk for COVID-19 are in a state of rapid change based on information released by regulatory bodies including the CDC and federal and state organizations. These policies and algorithms were followed during  the patient's care in the ED.   As part of my medical decision making, I reviewed the following data within the electronic MEDICAL RECORD NUMBER Nursing notes reviewed and incorporated, Labs reviewed , Old chart reviewed, Notes from prior ED visits and [place] Controlled Substance Database  ____________________________________________   FINAL CLINICAL IMPRESSION(S) / ED DIAGNOSES  Final diagnoses:  Urinary tract infection without hematuria, site unspecified      NEW MEDICATIONS STARTED DURING THIS VISIT:  Discharge Medication List as of 12/21/2019  3:28 PM    START taking these medications   Details  cephALEXin (KEFLEX) 500 MG capsule Take 1 capsule (500 mg total) by mouth 3 (three) times daily., Starting Wed 12/21/2019, Normal         Note:  This document was prepared using Dragon voice recognition software and may include unintentional dictation errors.    Faythe Ghee, PA-C 12/21/19 1752    Concha Se,  MD 12/22/19 7121

## 2019-12-21 NOTE — ED Triage Notes (Signed)
Pt comes via POV from home with c/o painful burning with urination. Pt states no discharge.  Pt states this has been going on for a few days.

## 2019-12-24 LAB — URINE CULTURE: Culture: >=100000 — AB

## 2020-04-03 ENCOUNTER — Other Ambulatory Visit: Payer: Self-pay

## 2020-04-03 ENCOUNTER — Emergency Department: Payer: Medicaid Other

## 2020-04-03 ENCOUNTER — Encounter: Payer: Self-pay | Admitting: Emergency Medicine

## 2020-04-03 DIAGNOSIS — J45909 Unspecified asthma, uncomplicated: Secondary | ICD-10-CM | POA: Insufficient documentation

## 2020-04-03 DIAGNOSIS — R1031 Right lower quadrant pain: Secondary | ICD-10-CM | POA: Diagnosis present

## 2020-04-03 DIAGNOSIS — Z79890 Hormone replacement therapy (postmenopausal): Secondary | ICD-10-CM | POA: Diagnosis not present

## 2020-04-03 DIAGNOSIS — R0781 Pleurodynia: Secondary | ICD-10-CM | POA: Diagnosis not present

## 2020-04-03 DIAGNOSIS — E039 Hypothyroidism, unspecified: Secondary | ICD-10-CM | POA: Insufficient documentation

## 2020-04-03 DIAGNOSIS — K219 Gastro-esophageal reflux disease without esophagitis: Secondary | ICD-10-CM | POA: Insufficient documentation

## 2020-04-03 DIAGNOSIS — E119 Type 2 diabetes mellitus without complications: Secondary | ICD-10-CM | POA: Diagnosis not present

## 2020-04-03 LAB — CBC WITH DIFFERENTIAL/PLATELET
Abs Immature Granulocytes: 0.05 10*3/uL (ref 0.00–0.07)
Basophils Absolute: 0.1 10*3/uL (ref 0.0–0.1)
Basophils Relative: 1 %
Eosinophils Absolute: 0.1 10*3/uL (ref 0.0–0.5)
Eosinophils Relative: 1 %
HCT: 41.9 % (ref 36.0–46.0)
Hemoglobin: 13.9 g/dL (ref 12.0–15.0)
Immature Granulocytes: 0 %
Lymphocytes Relative: 29 %
Lymphs Abs: 3.7 10*3/uL (ref 0.7–4.0)
MCH: 28.8 pg (ref 26.0–34.0)
MCHC: 33.2 g/dL (ref 30.0–36.0)
MCV: 86.9 fL (ref 80.0–100.0)
Monocytes Absolute: 0.7 10*3/uL (ref 0.1–1.0)
Monocytes Relative: 6 %
Neutro Abs: 8.4 10*3/uL — ABNORMAL HIGH (ref 1.7–7.7)
Neutrophils Relative %: 63 %
Platelets: 322 10*3/uL (ref 150–400)
RBC: 4.82 MIL/uL (ref 3.87–5.11)
RDW: 13.2 % (ref 11.5–15.5)
WBC: 13.1 10*3/uL — ABNORMAL HIGH (ref 4.0–10.5)
nRBC: 0 % (ref 0.0–0.2)

## 2020-04-03 LAB — URINALYSIS, COMPLETE (UACMP) WITH MICROSCOPIC
Bilirubin Urine: NEGATIVE
Glucose, UA: NEGATIVE mg/dL
Hgb urine dipstick: NEGATIVE
Ketones, ur: NEGATIVE mg/dL
Nitrite: NEGATIVE
Protein, ur: NEGATIVE mg/dL
Specific Gravity, Urine: 1.02 (ref 1.005–1.030)
pH: 6 (ref 5.0–8.0)

## 2020-04-03 LAB — COMPREHENSIVE METABOLIC PANEL
ALT: 36 U/L (ref 0–44)
AST: 24 U/L (ref 15–41)
Albumin: 4.2 g/dL (ref 3.5–5.0)
Alkaline Phosphatase: 67 U/L (ref 38–126)
Anion gap: 6 (ref 5–15)
BUN: 7 mg/dL (ref 6–20)
CO2: 28 mmol/L (ref 22–32)
Calcium: 9 mg/dL (ref 8.9–10.3)
Chloride: 104 mmol/L (ref 98–111)
Creatinine, Ser: 0.55 mg/dL (ref 0.44–1.00)
GFR calc Af Amer: >60 mL/min (ref >60)
GFR calc non Af Amer: >60 mL/min (ref >60)
Glucose, Bld: 84 mg/dL (ref 70–99)
Potassium: 3.8 mmol/L (ref 3.5–5.1)
Sodium: 138 mmol/L (ref 135–145)
Total Bilirubin: 0.5 mg/dL (ref 0.3–1.2)
Total Protein: 8.2 g/dL — ABNORMAL HIGH (ref 6.5–8.1)

## 2020-04-03 LAB — LIPASE, BLOOD: Lipase: 23 U/L (ref 11–51)

## 2020-04-03 LAB — POCT PREGNANCY, URINE: Preg Test, Ur: NEGATIVE

## 2020-04-03 LAB — TROPONIN I (HIGH SENSITIVITY): Troponin I (High Sensitivity): 3 ng/L (ref <18)

## 2020-04-04 ENCOUNTER — Emergency Department
Admission: EM | Admit: 2020-04-04 | Discharge: 2020-04-04 | Disposition: A | Discharge status: Home / Self Care | Payer: Medicaid Other | Attending: Emergency Medicine | Admitting: Emergency Medicine

## 2020-04-04 DIAGNOSIS — R0781 Pleurodynia: Secondary | ICD-10-CM

## 2020-04-04 DIAGNOSIS — R1031 Right lower quadrant pain: Secondary | ICD-10-CM

## 2020-04-04 DIAGNOSIS — K219 Gastro-esophageal reflux disease without esophagitis: Secondary | ICD-10-CM

## 2020-04-04 LAB — TROPONIN I (HIGH SENSITIVITY): Troponin I (High Sensitivity): 5 ng/L (ref <18)

## 2020-04-04 NOTE — ED Notes (Signed)
Pt eating [place].

## 2020-04-04 NOTE — ED Provider Notes (Signed)
[facility] Emergency Department Provider Note   ____________________________________________   First MD Initiated Contact with Patient 04/04/20 1230     (approximate)  I have reviewed the triage vital signs and the nursing notes.   HISTORY  Chief Complaint Chest Pain and Abdominal Pain    HPI Connie Romero is a 19 y.o. female with no stated past medical history who presents for chest tightness and right lower quadrant abdominal pain that began approximately 4 days prior to arrival.  Patient describes left-sided chest tightness that is worse after eating laying flat and occurs intermittently without any exacerbating or relieving factors and is approximately 3/10 in severity.  Patient also describes right lower quadrant abdominal pain that is worse when she is up and walking and partially relieved when she sits down.  Patient describes burning, 6/10 pain that does not radiate.         Past Medical History:  Diagnosis Date  . Abdominal pain   . Anxiety   . Asthma   . Cough   . Diabetes mellitus without complication (HCC)   . Fatty liver   . Hypothyroid   . Obesity   . Sleep apnea   . Thyroid disease     There are no problems to display for this patient.   Past Surgical History:  Procedure Laterality Date  . CHOLECYSTECTOMY N/A 10/06/2018   Procedure: LAPAROSCOPIC CHOLECYSTECTOMY;  Surgeon: Carolan Shiver, MD;  Location: [facility] ORS;  Service: General;  Laterality: N/A;  . hypothyroidisn    . pcos    . UVULOPALATOPHARYNGOPLASTY    . WISDOM TOOTH EXTRACTION      Prior to Admission medications   Medication Sig Start Date End Date Taking? Authorizing Provider  cephALEXin (KEFLEX) 500 MG capsule Take 1 capsule (500 mg total) by mouth 3 (three) times daily. 12/21/19   Sherrie Mustache Roselyn Bering, PA-C  levothyroxine (SYNTHROID, LEVOTHROID) 175 MCG tablet Take 175 mcg by mouth daily before breakfast. 04/13/18   [provider]     Allergies Shellfish-derived products  Family History  Problem Relation Age of Onset  . Diabetes Maternal Grandmother   . Hypertension Maternal Grandmother   . Kidney disease Maternal Grandmother   . Lung cancer Maternal Grandmother   . Pulmonary embolism Maternal Grandmother     Social History Social History   Tobacco Use  . Smoking status: Never Smoker  . Smokeless tobacco: Never Used  Vaping Use  . Vaping Use: Never used  Substance Use Topics  . Alcohol use: No  . Drug use: No    Review of Systems Constitutional: No fever/chills Eyes: No visual changes. ENT: No sore throat. Cardiovascular: Endorses chest pain. Respiratory: Denies shortness of breath. Gastrointestinal:  No nausea, no vomiting.  No diarrhea. Genitourinary: Negative for dysuria. Musculoskeletal: Negative for acute arthralgias Skin: Negative for rash. Neurological: Negative for headaches, weakness/numbness/paresthesias in any extremity Psychiatric: Negative for suicidal ideation/homicidal ideation   ____________________________________________   PHYSICAL EXAM:  VITAL SIGNS: ED Triage Vitals  Enc Vitals Group     BP 04/03/20 2225 (!) 141/79     Pulse Rate 04/03/20 2225 69     Resp 04/03/20 2225 18     Temp 04/03/20 2225 98.9 F (37.2 C)     Temp Source 04/03/20 2225 Oral     SpO2 04/03/20 2225 100 %     Weight 04/03/20 2226 250 lb (113.4 kg)     Height 04/03/20 2226 5\' 5"  (1.651 m)     Head Circumference --  Peak Flow --      Pain Score 04/03/20 2225 9     Pain Loc --      Pain Edu? --      Excl. in GC? --     Constitutional: Alert and oriented. Well appearing and in no acute distress.  Obese Eyes: Conjunctivae are normal. PERRL. EOMI. Head: Atraumatic. Nose: No congestion/rhinnorhea. Mouth/Throat: Mucous membranes are moist.  Oropharynx non-erythematous. Neck: No stridor.   Cardiovascular: Normal rate, regular rhythm. Grossly normal heart sounds.  Good peripheral  circulation. Respiratory: Normal respiratory effort.  No retractions. Lungs CTAB. Gastrointestinal: Soft and nontender. No distention. No abdominal bruits. No CVA tenderness. Musculoskeletal: No lower extremity tenderness nor edema.  No joint effusions. Neurologic:  Normal speech and language. No gross focal neurologic deficits are appreciated. No gait instability. Skin:  Skin is warm, dry and intact. No rash noted. Psychiatric: Mood and affect are normal. Speech and behavior are normal.  ____________________________________________   LABS (all labs ordered are listed, but only abnormal results are displayed)  Labs Reviewed  CBC WITH DIFFERENTIAL/PLATELET - Abnormal; Notable for the following components:      Result Value   WBC 13.1 (*)    Neutro Abs 8.4 (*)    All other components within normal limits  COMPREHENSIVE METABOLIC PANEL - Abnormal; Notable for the following components:   Total Protein 8.2 (*)    All other components within normal limits  URINALYSIS, COMPLETE (UACMP) WITH MICROSCOPIC - Abnormal; Notable for the following components:   Color, Urine YELLOW (*)    APPearance CLOUDY (*)    Leukocytes,Ua LARGE (*)    Bacteria, UA RARE (*)    All other components within normal limits  LIPASE, BLOOD  POCT PREGNANCY, URINE  TROPONIN I (HIGH SENSITIVITY)  TROPONIN I (HIGH SENSITIVITY)   ____________________________________________  EKG  ED ECG REPORT I, Merwyn Katos, the attending physician, personally viewed and interpreted this ECG.  Date: 04/04/2020 EKG Time: 2221 Rate: 75 Rhythm: normal sinus rhythm QRS Axis: normal Intervals: normal ST/T Wave abnormalities: normal Narrative Interpretation: no evidence of acute ischemia  ____________________________________________  RADIOLOGY  ED MD interpretation: 2 view of the chest shows no evidence of acute abnormalities  Official radiology report(s): DG Chest 2 View  Result Date: 04/03/2020 CLINICAL DATA:  Chest  pain EXAM: CHEST - 2 VIEW COMPARISON:  09/27/2018 FINDINGS: The heart size and mediastinal contours are within normal limits. Both lungs are clear. The visualized skeletal structures are unremarkable. IMPRESSION: No active cardiopulmonary disease. Electronically Signed   By: Burman Nieves M.D.   On: 04/03/2020 22:48    ____________________________________________   PROCEDURES  Procedure(s) performed (including Critical Care):  Procedures   ____________________________________________   INITIAL IMPRESSION / ASSESSMENT AND PLAN / ED COURSE     Patient presents for 3 days of intermittent vague chest and right lower quadrant abdominal pain   Workup: ECG, CXR, CBC, BMP, Troponin Findings: ECG: No overt evidence of STEMI. No evidence of Brugadas sign, delta wave, epsilon wave, significantly prolonged QTc, or malignant arrhythmia HS Troponin: Negative x1 Other Labs unremarkable for emergent problems. CXR: Without PTX, PNA, or widened mediastinum Last Stress Test: Never Last Heart Catheterization: Never HEART Score: 1  Given History, Exam, and Workup I have low suspicion for ACS, Pneumothorax, Pneumonia, Pulmonary Embolus, Tamponade, Aortic Dissection or other emergent problem as a cause for this presentation.   Reassesment: Prior to discharge patients pain was controlled and they were well appearing.  Disposition:  Discharge. Strict  return precautions discussed with patient with full understanding. Advised patient to follow up promptly with primary care provider      ____________________________________________   FINAL CLINICAL IMPRESSION(S) / ED DIAGNOSES  Final diagnoses:  Pleurodynia  Right lower quadrant pain  Gastroesophageal reflux disease without esophagitis     ED Discharge Orders    None       Note:  This document was prepared using Dragon voice recognition software and may include unintentional dictation errors.   Merwyn Katos, MD 04/04/20  [phone]

## 2020-07-19 ENCOUNTER — Other Ambulatory Visit: Payer: Self-pay

## 2020-07-19 ENCOUNTER — Emergency Department: Payer: Medicaid Other

## 2020-07-19 ENCOUNTER — Emergency Department
Admission: EM | Admit: 2020-07-19 | Discharge: 2020-07-20 | Disposition: A | Discharge status: Home / Self Care | Payer: Medicaid Other | Attending: Emergency Medicine | Admitting: Emergency Medicine

## 2020-07-19 DIAGNOSIS — J45909 Unspecified asthma, uncomplicated: Secondary | ICD-10-CM | POA: Insufficient documentation

## 2020-07-19 DIAGNOSIS — E119 Type 2 diabetes mellitus without complications: Secondary | ICD-10-CM | POA: Insufficient documentation

## 2020-07-19 DIAGNOSIS — Z79899 Other long term (current) drug therapy: Secondary | ICD-10-CM | POA: Insufficient documentation

## 2020-07-19 DIAGNOSIS — E039 Hypothyroidism, unspecified: Secondary | ICD-10-CM | POA: Diagnosis not present

## 2020-07-19 DIAGNOSIS — N939 Abnormal uterine and vaginal bleeding, unspecified: Secondary | ICD-10-CM | POA: Insufficient documentation

## 2020-07-19 HISTORY — DX: Polycystic ovarian syndrome: E28.2

## 2020-07-19 LAB — CBC
HCT: 42.5 % (ref 36.0–46.0)
Hemoglobin: 14.3 g/dL (ref 12.0–15.0)
MCH: 28.9 pg (ref 26.0–34.0)
MCHC: 33.6 g/dL (ref 30.0–36.0)
MCV: 85.9 fL (ref 80.0–100.0)
Platelets: 381 10*3/uL (ref 150–400)
RBC: 4.95 MIL/uL (ref 3.87–5.11)
RDW: 13.2 % (ref 11.5–15.5)
WBC: 14.4 10*3/uL — ABNORMAL HIGH (ref 4.0–10.5)
nRBC: 0 % (ref 0.0–0.2)

## 2020-07-19 LAB — URINALYSIS, COMPLETE (UACMP) WITH MICROSCOPIC
Bacteria, UA: NONE SEEN
RBC / HPF: >50 RBC/hpf (ref 0–5)
Specific Gravity, Urine: 1.031 — ABNORMAL HIGH (ref 1.005–1.030)
Squamous Epithelial / HPF: NONE SEEN (ref 0–5)
WBC, UA: NONE SEEN WBC/hpf (ref 0–5)

## 2020-07-19 LAB — BASIC METABOLIC PANEL
Anion gap: 8 (ref 5–15)
BUN: 8 mg/dL (ref 6–20)
CO2: 26 mmol/L (ref 22–32)
Calcium: 9.2 mg/dL (ref 8.9–10.3)
Chloride: 103 mmol/L (ref 98–111)
Creatinine, Ser: 0.63 mg/dL (ref 0.44–1.00)
GFR, Estimated: >60 mL/min (ref >60)
Glucose, Bld: 105 mg/dL — ABNORMAL HIGH (ref 70–99)
Potassium: 4 mmol/L (ref 3.5–5.1)
Sodium: 137 mmol/L (ref 135–145)

## 2020-07-19 LAB — POC URINE PREG, ED: Preg Test, Ur: NEGATIVE

## 2020-07-19 LAB — HCG, QUANTITATIVE, PREGNANCY: hCG, Beta Chain, Quant, S: <1 m[IU]/mL (ref <5)

## 2020-07-19 NOTE — ED Provider Notes (Signed)
[facility] Emergency Department Provider Note    ____________________________________________   I have reviewed the triage vital signs and the nursing notes.   HISTORY  Chief Complaint Vaginal Bleeding   History limited by: Not Limited   HPI Connie Romero is a 19 y.o. female who presents to the emergency department today because of concerns for vaginal bleeding.  Patient states that the bleeding started today.  It has been accompanied by abdominal discomfort.  She states that she finished her previous.  Roughly 1 week ago.  She denies history of abnormal periods or significantly heavy periods.  The patient states she does have a history of PCOS.   Records reviewed. Per medical record review patient has a history of PCOS  Past Medical History:  Diagnosis Date  . Abdominal pain   . Anxiety   . Asthma   . Cough   . Diabetes mellitus without complication (HCC)   . Fatty liver   . Hypothyroid   . Obesity   . PCOS (polycystic ovarian syndrome)   . Sleep apnea   . Thyroid disease     There are no problems to display for this patient.   Past Surgical History:  Procedure Laterality Date  . CHOLECYSTECTOMY N/A 10/06/2018   Procedure: LAPAROSCOPIC CHOLECYSTECTOMY;  Surgeon: Carolan Shiver, MD;  Location: [facility] ORS;  Service: General;  Laterality: N/A;  . hypothyroidisn    . pcos    . UVULOPALATOPHARYNGOPLASTY    . WISDOM TOOTH EXTRACTION      Prior to Admission medications   Medication Sig Start Date End Date Taking? Authorizing Provider  cephALEXin (KEFLEX) 500 MG capsule Take 1 capsule (500 mg total) by mouth 3 (three) times daily. 12/21/19   Sherrie Mustache Roselyn Bering, PA-C  levothyroxine (SYNTHROID, LEVOTHROID) 175 MCG tablet Take 175 mcg by mouth daily before breakfast. 04/13/18   [provider]    Allergies Shellfish-derived products  Family History  Problem Relation Age of Onset  . Diabetes Maternal Grandmother   . Hypertension  Maternal Grandmother   . Kidney disease Maternal Grandmother   . Lung cancer Maternal Grandmother   . Pulmonary embolism Maternal Grandmother     Social History Social History   Tobacco Use  . Smoking status: Never Smoker  . Smokeless tobacco: Never Used  Vaping Use  . Vaping Use: Never used  Substance Use Topics  . Alcohol use: No  . Drug use: No    Review of Systems Constitutional: No fever/chills Eyes: No visual changes. ENT: No sore throat. Cardiovascular: Denies chest pain. Respiratory: Denies shortness of breath. Gastrointestinal: Positive for lower abdominal cramping. Genitourinary: Positive for vaginal bleeding. Musculoskeletal: Negative for back pain. Skin: Negative for rash. Neurological: Negative for headaches, focal weakness or numbness.  ____________________________________________   PHYSICAL EXAM:  VITAL SIGNS: ED Triage Vitals  Enc Vitals Group     BP 07/19/20 1420 127/76     Pulse Rate 07/19/20 1420 (!) 101     Resp 07/19/20 1420 18     Temp 07/19/20 1422 98.2 F (36.8 C)     Temp Source 07/19/20 1420 Oral     SpO2 07/19/20 1420 97 %     Weight 07/19/20 1420 260 lb (117.9 kg)     Height 07/19/20 1420 5\' 5"  (1.651 m)     Head Circumference --      Peak Flow --      Pain Score 07/19/20 1419 10   Constitutional: Alert and oriented.  Eyes: Conjunctivae are normal.  ENT      Head: Normocephalic and atraumatic.      Nose: No congestion/rhinnorhea.      Mouth/Throat: Mucous membranes are moist.      Neck: No stridor. Hematological/Lymphatic/Immunilogical: No cervical lymphadenopathy. Cardiovascular: Normal rate, regular rhythm.  No murmurs, rubs, or gallops.  Respiratory: Normal respiratory effort without tachypnea nor retractions. Breath sounds are clear and equal bilaterally. No wheezes/rales/rhonchi. Gastrointestinal: Soft and minimally tender in the lower abdomen. Genitourinary: Deferred Musculoskeletal: Normal range of motion in all  extremities. No lower extremity edema. Neurologic:  Normal speech and language. No gross focal neurologic deficits are appreciated.  Skin:  Skin is warm, dry and intact. No rash noted. Psychiatric: Mood and affect are normal. Speech and behavior are normal. Patient exhibits appropriate insight and judgment.  ____________________________________________    LABS (pertinent positives/negatives)  CBC wbc 14.4, hgb 14.3, plt 381 BMP wnl except glu 105 Upreg negative hcg <1 UA >50 RBC, none seen wbc ____________________________________________   EKG  None  ____________________________________________    RADIOLOGY  [name] pending  ____________________________________________   PROCEDURES  Procedures  ____________________________________________   INITIAL IMPRESSION / ASSESSMENT AND PLAN / ED COURSE  Pertinent labs & imaging results that were available during my care of the patient were reviewed by me and considered in my medical decision making (see chart for details).   Patient presented to the emergency department today because of concerns for abnormal vaginal bleeding.  Urine pregnancy and quantitative beta hCG both negative here.  Patient will be sent for ultrasound to evaluate for possible structural lesion.  UA without any white blood cells. Blood work without any concerning anemia. [name] pending at time of sign out. Would anticipate likely discharge home.  ____________________________________________   FINAL CLINICAL IMPRESSION(S) / ED DIAGNOSES  Final diagnoses:  Vagina bleeding  Abnormal vaginal bleeding     Note: This dictation was prepared with Dragon dictation. Any transcriptional errors that result from this process are unintentional     Phineas Semen, MD 07/19/20 2252

## 2020-07-19 NOTE — ED Provider Notes (Signed)
----------------------------------------- °  11:50 PM on 07/19/2020 -----------------------------------------  Pelvic ultrasound interpreted per Dr. Londell Moh: Multiple small bilateral ovarian follicles.  Updated patient on ultrasound results.  Referred to GYN for outpatient follow-up.  Strict return precautions given.  Patient verbalizes understanding agrees with plan of care.   Irean Hong, MD 07/20/20 [phone]

## 2020-07-19 NOTE — ED Triage Notes (Signed)
Pt comes into the ED via EMS from home with c/o sudden onset heavy vaginal bleeding today, states she just had normal period on 12/15. Pt c/o lower abd cramping. Hx of PCOS.

## 2020-07-19 NOTE — Discharge Instructions (Addendum)
Please seek medical attention for any high fevers, chest pain, shortness of breath, change in behavior, persistent vomiting, bloody stool or any other new or concerning symptoms.  

## 2020-09-28 ENCOUNTER — Other Ambulatory Visit: Payer: Self-pay

## 2020-09-28 ENCOUNTER — Encounter: Payer: Self-pay | Admitting: Obstetrics and Gynecology

## 2020-09-28 ENCOUNTER — Ambulatory Visit (INDEPENDENT_AMBULATORY_CARE_PROVIDER_SITE_OTHER): Payer: Medicaid Other | Admitting: Obstetrics and Gynecology

## 2020-09-28 VITALS — BP 146/78 | HR 69 | Temp 97.9°F | Ht 65.0 in | Wt 277.1 lb

## 2020-09-28 DIAGNOSIS — Z30011 Encounter for initial prescription of contraceptive pills: Secondary | ICD-10-CM | POA: Diagnosis not present

## 2020-09-28 DIAGNOSIS — Z6841 Body Mass Index (BMI) 40.0 and over, adult: Secondary | ICD-10-CM

## 2020-09-28 DIAGNOSIS — E8881 Metabolic syndrome: Secondary | ICD-10-CM | POA: Diagnosis not present

## 2020-09-28 DIAGNOSIS — E282 Polycystic ovarian syndrome: Secondary | ICD-10-CM | POA: Diagnosis not present

## 2020-09-28 MED ORDER — SLYND 4 MG PO TABS: 1.0000 | ORAL_TABLET | Freq: Every day | ORAL | 3 refills | Status: DC

## 2020-09-28 NOTE — Progress Notes (Signed)
HPI:      Connie Romero is a 20 y.o. G0P0000 who LMP was Patient's last menstrual period was 08/24/2020.  Subjective:   She presents today because she has had vaginal bleeding for approximately 6 weeks.  She would like bleeding control and she would like to get on some type of birth control.  She states that she has a history of PCO with thyroid issues and insulin resistance.  She reports irregular cycles for most of her life.  She says she is currently not sexually active and has not been sexually active in more than 2 months.  She describes a history of previous Depo-Provera use but she was not happy with it because it caused significant weight gain.  She has heard bad things about the IUD and is not interested.    Hx: The following portions of the patient's history were reviewed and updated as appropriate:             She  has a past medical history of Abdominal pain, Anxiety, Asthma, Cough, Diabetes mellitus without complication (HCC), Fatty liver, Hypothyroid, Obesity, PCOS (polycystic ovarian syndrome), Sleep apnea, and Thyroid disease. She does not have any pertinent problems on file. She  has a past surgical history that includes Uvulopalatopharyngoplasty; hypothyroidisn; pcos; Wisdom tooth extraction; and Cholecystectomy (N/A, 10/06/2018). Her family history includes Diabetes in her maternal grandmother; Hypertension in her maternal grandmother; Kidney disease in her maternal grandmother; Lung cancer in her maternal grandmother; Pulmonary embolism in her maternal grandmother. She  reports that she has never smoked. She has never used smokeless tobacco. She reports current alcohol use. She reports that she does not use drugs. She has a current medication list which includes the following prescription(s): slynd and levothyroxine sodium. She is allergic to shellfish-derived products.       Review of Systems:  Review of Systems  Constitutional: Denied constitutional symptoms, night sweats,  recent illness, fatigue, fever, insomnia and weight loss.  Eyes: Denied eye symptoms, eye pain, photophobia, vision change and visual disturbance.  Ears/Nose/Throat/Neck: Denied ear, nose, throat or neck symptoms, hearing loss, nasal discharge, sinus congestion and sore throat.  Cardiovascular: Denied cardiovascular symptoms, arrhythmia, chest pain/pressure, edema, exercise intolerance, orthopnea and palpitations.  Respiratory: Denied pulmonary symptoms, asthma, pleuritic pain, productive sputum, cough, dyspnea and wheezing.  Gastrointestinal: Denied, gastro-esophageal reflux, melena, nausea and vomiting.  Genitourinary: Denied genitourinary symptoms including symptomatic vaginal discharge, pelvic relaxation issues, and urinary complaints.  Musculoskeletal: Denied musculoskeletal symptoms, stiffness, swelling, muscle weakness and myalgia.  Dermatologic: Denied dermatology symptoms, rash and scar.  Neurologic: Denied neurology symptoms, dizziness, headache, neck pain and syncope.  Psychiatric: Denied psychiatric symptoms, anxiety and depression.  Endocrine: Denied endocrine symptoms including hot flashes and night sweats.   Meds:   Current Outpatient Medications on File Prior to Visit  Medication Sig Dispense Refill  . Levothyroxine Sodium 50 MCG CAPS Take 50 mcg by mouth daily before breakfast.  11   No current facility-administered medications on file prior to visit.       The pregnancy intention screening data noted above was reviewed. Potential methods of contraception were discussed. The patient elected to proceed with Oral Contraceptive.     Objective:     Vitals:   09/28/20 0958  BP: (!) 146/78  Pulse: 69  Temp: 97.9 F (36.6 C)   Filed Weights   09/28/20 0958  Weight: 277 lb 1.6 oz (125.7 kg)  Assessment:    G0P0000 Patient Active Problem List   Diagnosis Date Noted  . Polycystic ovarian syndrome 05/19/2017  . Mild obstructive sleep apnea  12/01/2016  . Morbid obesity (HCC) 12/01/2016  . PVC (premature ventricular contraction) 09/17/2016  . Acquired hypothyroidism 07/15/2016     1. Initiation of OCP (BCP)   2. PCO (polycystic ovaries)   3. Insulin resistance   4. Morbid obesity with BMI of 45.0-49.9, adult (HCC)     With morbid obesity and history of PCO she desires cycle control and birth control.  OCP is not a good option because of elevated BMI.  Patient not interested in IUD.  Had a bad experience with Depo-Provera and weight gain.   Plan:            1.  OCPs The risks /benefits of OCPs have been explained to the patient in detail.  Product literature has been given to her where appropriate.  I have instructed her in the use of OCPs.  I have explained to the patient that OCPs are not as effective for birth control during the first month of use, and that another form of contraception should be used during this time.  Both first-day start and Sunday start have been explained.  The risks and benefits of each was discussed.  She has been made aware of  the fact that in rare circumstances, other medications may affect the efficacy of OCPs.  I have answered all of her questions, and I believe that she has an understanding of the effectiveness and use of OCPs. We will start Slynd.  Patient instructed in its use.  Rationale for this medication discussed in detail. 2.  Strongly recommend patient follow-up with her PCP regarding insulin resistance.  Encouraged her to get back on Metformin as previously prescribed.  Orders No orders of the defined types were placed in this encounter.    Meds ordered this encounter  Medications  . Drospirenone (SLYND) 4 MG TABS    Sig: Take 1 tablet by mouth at bedtime.    Dispense:  28 tablet    Refill:  3      F/U  Return in about 3 months (around 12/29/2020). I spent 31 minutes involved in the care of this patient preparing to see the patient by obtaining and reviewing her medical history  (including labs, imaging tests and prior procedures), documenting clinical information in the electronic health record (EHR), counseling and coordinating care plans, writing and sending prescriptions, ordering tests or procedures and directly communicating with the patient by discussing pertinent items from her history and physical exam as well as detailing my assessment and plan as noted above so that she has an informed understanding.  All of her questions were answered.  Elonda Husky, M.D. 09/28/2020 10:35 AM

## 2020-10-14 ENCOUNTER — Emergency Department
Admission: EM | Admit: 2020-10-14 | Discharge: 2020-10-14 | Disposition: A | Discharge status: Home / Self Care | Payer: Medicaid Other | Attending: Emergency Medicine | Admitting: Emergency Medicine

## 2020-10-14 ENCOUNTER — Other Ambulatory Visit: Payer: Self-pay

## 2020-10-14 ENCOUNTER — Emergency Department: Payer: Medicaid Other

## 2020-10-14 DIAGNOSIS — R079 Chest pain, unspecified: Secondary | ICD-10-CM | POA: Diagnosis not present

## 2020-10-14 DIAGNOSIS — M79602 Pain in left arm: Secondary | ICD-10-CM | POA: Insufficient documentation

## 2020-10-14 DIAGNOSIS — M25512 Pain in left shoulder: Secondary | ICD-10-CM | POA: Insufficient documentation

## 2020-10-14 DIAGNOSIS — Z79899 Other long term (current) drug therapy: Secondary | ICD-10-CM | POA: Insufficient documentation

## 2020-10-14 DIAGNOSIS — M542 Cervicalgia: Secondary | ICD-10-CM | POA: Diagnosis not present

## 2020-10-14 DIAGNOSIS — E119 Type 2 diabetes mellitus without complications: Secondary | ICD-10-CM | POA: Diagnosis not present

## 2020-10-14 DIAGNOSIS — E039 Hypothyroidism, unspecified: Secondary | ICD-10-CM | POA: Diagnosis not present

## 2020-10-14 DIAGNOSIS — J45909 Unspecified asthma, uncomplicated: Secondary | ICD-10-CM | POA: Insufficient documentation

## 2020-10-14 LAB — TROPONIN I (HIGH SENSITIVITY): Troponin I (High Sensitivity): 2 ng/L (ref <18)

## 2020-10-14 LAB — D-DIMER, QUANTITATIVE: D-Dimer, Quant: 0.41 ug/mL-FEU (ref 0.00–0.50)

## 2020-10-14 MED ORDER — ACETAMINOPHEN 500 MG PO TABS
1000.0000 mg | ORAL_TABLET | Freq: Once | ORAL | Status: AC
  Administered 2020-10-14: 1000 mg via ORAL
  Filled 2020-10-14: qty 2

## 2020-10-14 MED ORDER — LIDOCAINE 5 % EX PTCH
1.0000 | MEDICATED_PATCH | CUTANEOUS | Status: DC
  Administered 2020-10-14: 1 via TRANSDERMAL
  Filled 2020-10-14: qty 1

## 2020-10-14 MED ORDER — NAPROXEN 500 MG PO TABS
500.0000 mg | ORAL_TABLET | Freq: Once | ORAL | Status: AC
  Administered 2020-10-14: 500 mg via ORAL
  Filled 2020-10-14: qty 1

## 2020-10-14 NOTE — ED Triage Notes (Signed)
Pt presents to ER c/o left shoulder pain that started yesterday.  Pt states pain radiates into left side of neck and into back.  Pt states she did not do anything to injure her neck.  Pt reports limited rom in neck and left arm.  Pt ambulatory in triage.  No distress noted.

## 2020-10-14 NOTE — ED Provider Notes (Signed)
[facility] Emergency Department Provider Note  ____________________________________________   Event Date/Time   First MD Initiated Contact with Patient 10/14/20 2130     (approximate)  I have reviewed the triage vital signs and the nursing notes.   HISTORY  Chief Complaint Shoulder Pain   HPI Connie Romero is a 20 y.o. female the past medical history of asthma, cough, anxiety, DM, hypothyroidism, PCOS, OSA and obesity who presents for assessment of some left posterior shoulder pain rating into her chest and left arm that began yesterday.  Patient states she also feels it is rating slightly to her neck.  She denies any heavy lifting twisting falls or injuries precipitating onset of symptoms.  She has not taken anything for her symptoms.  She denies any earache, vision changes, headache, sore throat, cough, fevers, shortness of breath abdominal pain, vomiting, diarrhea, dysuria, rash or other extremity pain weakness numbness or tingling.  No prior similar episodes.  No clearly fitting her brain factors.  She denies EtOH use illicit drug use or tobacco abuse.         Past Medical History:  Diagnosis Date  . Abdominal pain   . Anxiety   . Asthma   . Cough   . Diabetes mellitus without complication (HCC)   . Fatty liver   . Hypothyroid   . Obesity   . PCOS (polycystic ovarian syndrome)   . Sleep apnea   . Thyroid disease     Patient Active Problem List   Diagnosis Date Noted  . Polycystic ovarian syndrome 05/19/2017  . Mild obstructive sleep apnea 12/01/2016  . Morbid obesity (HCC) 12/01/2016  . PVC (premature ventricular contraction) 09/17/2016  . Acquired hypothyroidism 07/15/2016    Past Surgical History:  Procedure Laterality Date  . CHOLECYSTECTOMY N/A 10/06/2018   Procedure: LAPAROSCOPIC CHOLECYSTECTOMY;  Surgeon: Carolan Shiver, MD;  Location: [facility] ORS;  Service: General;  Laterality: N/A;  . hypothyroidisn    . pcos    .  UVULOPALATOPHARYNGOPLASTY    . WISDOM TOOTH EXTRACTION      Prior to Admission medications   Medication Sig Start Date End Date Taking? Authorizing Provider  Drospirenone (SLYND) 4 MG TABS Take 1 tablet by mouth at bedtime. 09/28/20   Linzie Collin, MD  Levothyroxine Sodium 50 MCG CAPS Take 50 mcg by mouth daily before breakfast. 04/13/18   [provider]    Allergies Shellfish-derived products  Family History  Problem Relation Age of Onset  . Diabetes Maternal Grandmother   . Hypertension Maternal Grandmother   . Kidney disease Maternal Grandmother   . Lung cancer Maternal Grandmother   . Pulmonary embolism Maternal Grandmother     Social History Social History   Tobacco Use  . Smoking status: Never Smoker  . Smokeless tobacco: Never Used  Vaping Use  . Vaping Use: Every day  . Start date: 09/26/2018  . Substances: Nicotine, Flavoring  Substance Use Topics  . Alcohol use: Yes    Comment: Rare  . Drug use: No    Review of Systems  Review of Systems  Constitutional: Negative for chills and fever.  HENT: Negative for sore throat.   Eyes: Negative for pain.  Respiratory: Negative for cough and stridor.   Cardiovascular: Positive for chest pain.  Gastrointestinal: Negative for vomiting.  Musculoskeletal: Positive for myalgias ( L shoulder ).  Skin: Negative for rash.  Neurological: Negative for seizures, loss of consciousness and headaches.  Psychiatric/Behavioral: Negative for suicidal ideas.  All other  systems reviewed and are negative.     ____________________________________________   PHYSICAL EXAM:  VITAL SIGNS: ED Triage Vitals  Enc Vitals Group     BP 10/14/20 2051 134/82     Pulse Rate 10/14/20 2051 97     Resp 10/14/20 2051 18     Temp 10/14/20 2051 98.2 F (36.8 C)     Temp Source 10/14/20 2051 Oral     SpO2 10/14/20 2051 100 %     Weight 10/14/20 2053 250 lb (113.4 kg)     Height 10/14/20 2053 5\' 5"  (1.651 m)     Head  Circumference --      Peak Flow --      Pain Score 10/14/20 2052 10     Pain Loc --      Pain Edu? --      Excl. in GC? --    Vitals:   10/14/20 2051 10/14/20 2238  BP: 134/82 (!) 123/57  Pulse: 97 63  Resp: 18 18  Temp: 98.2 F (36.8 C)   SpO2: 100% 99%   Physical Exam Vitals and nursing note reviewed.  Constitutional:      General: She is not in acute distress.    Appearance: She is well-developed. She is obese.  HENT:     Head: Normocephalic and atraumatic.     Right Ear: External ear normal.     Left Ear: External ear normal.     Nose: Nose normal.  Eyes:     Conjunctiva/sclera: Conjunctivae normal.  Cardiovascular:     Rate and Rhythm: Normal rate and regular rhythm.     Heart sounds: No murmur heard.   Pulmonary:     Effort: Pulmonary effort is normal. No respiratory distress.     Breath sounds: Normal breath sounds.  Abdominal:     Palpations: Abdomen is soft.     Tenderness: There is no abdominal tenderness.  Musculoskeletal:     Cervical back: Neck supple.  Skin:    General: Skin is warm and dry.     Capillary Refill: Capillary refill takes less than 2 seconds.  Neurological:     Mental Status: She is alert and oriented to person, place, and time.  Psychiatric:        Mood and Affect: Mood normal.     2+ bilateral radial pulses.  Patient has full strength and sensation throughout the bilateral upper extremities but has some pain on passive range of motion at the left shoulder.  Sensation is intact in the distribution of the ulnar radial median nerves throughout the bilateral upper extremities.  No overlying skin changes over the patient's posterior shoulder, left upper back, or throughout the forearm. ____________________________________________   LABS (all labs ordered are listed, but only abnormal results are displayed)  Labs Reviewed  D-DIMER, QUANTITATIVE  TROPONIN I (HIGH SENSITIVITY)  TROPONIN I (HIGH SENSITIVITY)    ____________________________________________  EKG  Sinus rhythm with T wave changes in inferior lateral leads without other clear evidence of acute ischemia.  Unremarkable intervals and no evidence of underlying arrhythmia. ____________________________________________  RADIOLOGY  ED MD interpretation: No evidence of left apical pneumothorax, fracture or dislocation.  Official radiology report(s): DG Shoulder Left  Result Date: 10/14/2020 CLINICAL DATA:  Left shoulder pain EXAM: LEFT SHOULDER - 2+ VIEW COMPARISON:  None. FINDINGS: There is no evidence of fracture or dislocation. There is no evidence of arthropathy or other focal bone abnormality. Soft tissues are unremarkable. IMPRESSION: Negative. Electronically Signed   By: 10/16/2020  Dover M.D.   On: 10/14/2020 21:28    ____________________________________________   PROCEDURES  Procedure(s) performed (including Critical Care):  .1-3 Lead EKG Interpretation Performed by: Gilles Chiquito, MD Authorized by: Gilles Chiquito, MD     Interpretation: normal     ECG rate assessment: normal     Rhythm: sinus rhythm     Ectopy: none     Conduction: normal       ____________________________________________   INITIAL IMPRESSION / ASSESSMENT AND PLAN / ED COURSE      Patient presents for assessment of some nontraumatic left-sided shoulder pain.  6 started yesterday.  On arrival she is afebrile and hemodynamically stable.  She does have some tenderness over her left trapezius but is otherwise neurovascularly intact throughout the left upper extremity which is otherwise unremarkable.  She does have a little bit of discomfort on passive range of motion at the left shoulder though.  No evidence on x-ray of apical pneumothorax fracture dislocation.  Patient is neurovascular intact throughout her left arm and there is no significant edema to suggest lymphatic blockage.  No evidence of cellulitis or other acute infectious process on  exam.  While ECG does have some nonspecific findings given nonelevated troponin pain greater than 3 hours after symptom onset a very low suspicion for ACS or myocarditis.  D-dimer is less than 0.5 and overall very low suspicion for PE or DVT.  Impression is likely MSK.  Patient treated with below noted analgesia.  Discharged stable condition.  Strict return precautions advised and discussed.      ____________________________________________   FINAL CLINICAL IMPRESSION(S) / ED DIAGNOSES  Final diagnoses:  Acute pain of left shoulder    Medications  lidocaine (LIDODERM) 5 % 1 patch (1 patch Transdermal Patch Applied 10/14/20 2143)  acetaminophen (TYLENOL) tablet 1,000 mg (1,000 mg Oral Given 10/14/20 2142)  naproxen (NAPROSYN) tablet 500 mg (500 mg Oral Given 10/14/20 2142)     ED Discharge Orders    None       Note:  This document was prepared using Dragon voice recognition software and may include unintentional dictation errors.   Gilles Chiquito, MD 10/15/20 [phone]

## 2020-11-23 ENCOUNTER — Other Ambulatory Visit: Payer: Self-pay

## 2020-11-23 ENCOUNTER — Encounter: Payer: Self-pay | Admitting: Emergency Medicine

## 2020-11-23 DIAGNOSIS — W2107XA Struck by softball, initial encounter: Secondary | ICD-10-CM | POA: Diagnosis not present

## 2020-11-23 DIAGNOSIS — Z5321 Procedure and treatment not carried out due to patient leaving prior to being seen by health care provider: Secondary | ICD-10-CM | POA: Diagnosis not present

## 2020-11-23 DIAGNOSIS — Y9364 Activity, baseball: Secondary | ICD-10-CM | POA: Diagnosis not present

## 2020-11-23 DIAGNOSIS — S0990XA Unspecified injury of head, initial encounter: Secondary | ICD-10-CM | POA: Diagnosis not present

## 2020-11-23 NOTE — ED Triage Notes (Signed)
Pt reports she was playing softball and got hit with the ball to left temple. Pt reports headache and blurry vision. Pt denies passing out, has dizziness. Pt talks in complete sentences no respiratory distress noted

## 2020-11-24 ENCOUNTER — Emergency Department
Admission: EM | Admit: 2020-11-24 | Discharge: 2020-11-24 | Disposition: A | Discharge status: Home / Self Care | Payer: Medicaid Other | Attending: Emergency Medicine | Admitting: Emergency Medicine

## 2020-11-24 NOTE — ED Notes (Signed)
Pt states is leaving her mother is here to get her. Pt signed MSE form and encouraged to stay but declines.

## 2020-12-20 ENCOUNTER — Other Ambulatory Visit: Payer: Self-pay

## 2020-12-20 DIAGNOSIS — E119 Type 2 diabetes mellitus without complications: Secondary | ICD-10-CM | POA: Diagnosis not present

## 2020-12-20 DIAGNOSIS — Z6841 Body Mass Index (BMI) 40.0 and over, adult: Secondary | ICD-10-CM | POA: Diagnosis not present

## 2020-12-20 DIAGNOSIS — Z79899 Other long term (current) drug therapy: Secondary | ICD-10-CM | POA: Diagnosis not present

## 2020-12-20 DIAGNOSIS — J45909 Unspecified asthma, uncomplicated: Secondary | ICD-10-CM | POA: Insufficient documentation

## 2020-12-20 DIAGNOSIS — Z20822 Contact with and (suspected) exposure to covid-19: Secondary | ICD-10-CM | POA: Diagnosis not present

## 2020-12-20 DIAGNOSIS — R1031 Right lower quadrant pain: Secondary | ICD-10-CM | POA: Insufficient documentation

## 2020-12-20 DIAGNOSIS — E039 Hypothyroidism, unspecified: Secondary | ICD-10-CM | POA: Diagnosis not present

## 2020-12-20 DIAGNOSIS — E282 Polycystic ovarian syndrome: Secondary | ICD-10-CM | POA: Diagnosis not present

## 2020-12-20 LAB — CBC WITH DIFFERENTIAL/PLATELET
Abs Immature Granulocytes: 0.05 10*3/uL (ref 0.00–0.07)
Basophils Absolute: 0.1 10*3/uL (ref 0.0–0.1)
Basophils Relative: 1 %
Eosinophils Absolute: 0.2 10*3/uL (ref 0.0–0.5)
Eosinophils Relative: 1 %
HCT: 38.9 % (ref 36.0–46.0)
Hemoglobin: 12.4 g/dL (ref 12.0–15.0)
Immature Granulocytes: 0 %
Lymphocytes Relative: 29 %
Lymphs Abs: 4.7 10*3/uL — ABNORMAL HIGH (ref 0.7–4.0)
MCH: 26.2 pg (ref 26.0–34.0)
MCHC: 31.9 g/dL (ref 30.0–36.0)
MCV: 82.1 fL (ref 80.0–100.0)
Monocytes Absolute: 1 10*3/uL (ref 0.1–1.0)
Monocytes Relative: 6 %
Neutro Abs: 10.3 10*3/uL — ABNORMAL HIGH (ref 1.7–7.7)
Neutrophils Relative %: 63 %
Platelets: 352 10*3/uL (ref 150–400)
RBC: 4.74 MIL/uL (ref 3.87–5.11)
RDW: 15.2 % (ref 11.5–15.5)
WBC: 16.4 10*3/uL — ABNORMAL HIGH (ref 4.0–10.5)
nRBC: 0 % (ref 0.0–0.2)

## 2020-12-20 LAB — COMPREHENSIVE METABOLIC PANEL
ALT: 27 U/L (ref 0–44)
AST: 26 U/L (ref 15–41)
Albumin: 4.3 g/dL (ref 3.5–5.0)
Alkaline Phosphatase: 62 U/L (ref 38–126)
Anion gap: 10 (ref 5–15)
BUN: 9 mg/dL (ref 6–20)
CO2: 23 mmol/L (ref 22–32)
Calcium: 9.2 mg/dL (ref 8.9–10.3)
Chloride: 107 mmol/L (ref 98–111)
Creatinine, Ser: 0.74 mg/dL (ref 0.44–1.00)
GFR, Estimated: >60 mL/min (ref >60)
Glucose, Bld: 97 mg/dL (ref 70–99)
Potassium: 3.6 mmol/L (ref 3.5–5.1)
Sodium: 140 mmol/L (ref 135–145)
Total Bilirubin: 0.9 mg/dL (ref 0.3–1.2)
Total Protein: 8.2 g/dL — ABNORMAL HIGH (ref 6.5–8.1)

## 2020-12-20 LAB — LIPASE, BLOOD: Lipase: 22 U/L (ref 11–51)

## 2020-12-20 NOTE — ED Triage Notes (Signed)
Pt in with co RLQ pain that started today, no n.v.d or dysuria. States worse when she walks or moves.

## 2020-12-21 ENCOUNTER — Emergency Department: Payer: Medicaid Other

## 2020-12-21 ENCOUNTER — Emergency Department
Admission: EM | Admit: 2020-12-21 | Discharge: 2020-12-21 | Disposition: A | Discharge status: Home / Self Care | Payer: Medicaid Other | Attending: Emergency Medicine | Admitting: Emergency Medicine

## 2020-12-21 DIAGNOSIS — N3000 Acute cystitis without hematuria: Secondary | ICD-10-CM

## 2020-12-21 DIAGNOSIS — E282 Polycystic ovarian syndrome: Secondary | ICD-10-CM

## 2020-12-21 DIAGNOSIS — Z6841 Body Mass Index (BMI) 40.0 and over, adult: Secondary | ICD-10-CM

## 2020-12-21 DIAGNOSIS — R1031 Right lower quadrant pain: Secondary | ICD-10-CM

## 2020-12-21 LAB — URINALYSIS, COMPLETE (UACMP) WITH MICROSCOPIC
Bilirubin Urine: NEGATIVE
Glucose, UA: NEGATIVE mg/dL
Ketones, ur: NEGATIVE mg/dL
Nitrite: POSITIVE — AB
Protein, ur: 30 mg/dL — AB
Specific Gravity, Urine: 1.026 (ref 1.005–1.030)
pH: 5 (ref 5.0–8.0)

## 2020-12-21 LAB — HCG, QUANTITATIVE, PREGNANCY: hCG, Beta Chain, Quant, S: <1 m[IU]/mL (ref <5)

## 2020-12-21 LAB — RESP PANEL BY RT-PCR (FLU A&B, COVID) ARPGX2
Influenza A by PCR: NEGATIVE
Influenza B by PCR: NEGATIVE
SARS Coronavirus 2 by RT PCR: NEGATIVE

## 2020-12-21 MED ORDER — HYDROMORPHONE HCL 1 MG/ML IJ SOLN
0.5000 mg | Freq: Once | INTRAMUSCULAR | Status: AC
  Administered 2020-12-21: 0.5 mg via INTRAVENOUS
  Filled 2020-12-21: qty 1

## 2020-12-21 MED ORDER — SODIUM CHLORIDE 0.9 % IV SOLN
1.0000 g | Freq: Once | INTRAVENOUS | Status: AC
  Administered 2020-12-21: 1 g via INTRAVENOUS
  Filled 2020-12-21: qty 10

## 2020-12-21 MED ORDER — CEPHALEXIN 500 MG PO CAPS: 500.0000 mg | ORAL_CAPSULE | Freq: Four times a day (QID) | ORAL | 0 refills | Status: AC

## 2020-12-21 MED ORDER — ONDANSETRON HCL 4 MG/2ML IJ SOLN
4.0000 mg | Freq: Once | INTRAMUSCULAR | Status: AC
  Administered 2020-12-21: 4 mg via INTRAVENOUS
  Filled 2020-12-21: qty 2

## 2020-12-21 MED ORDER — KETOROLAC TROMETHAMINE 30 MG/ML IJ SOLN
15.0000 mg | Freq: Once | INTRAMUSCULAR | Status: AC
  Administered 2020-12-21: 15 mg via INTRAVENOUS
  Filled 2020-12-21: qty 1

## 2020-12-21 NOTE — Consult Note (Signed)
Patient ID: Connie Romero, female   DOB: July 31, 2000, 20 y.o.   MRN: [id number]  HPI Connie Romero is a 20 y.o. female in consultation at the request of Dr. Katrinka Blazing.  She came in for right lower quadrant pain that she rates as severe intermittent and sharp in nature.  The patient localizes the pain in the right lower quadrant.  No specific alleviating factors, pain seems to be worsening with movement.  Patient started the pain yesterday.  She also reports decreased appetite. She does have a history of PCOS.  Also history of prior abdominal pain, high BMI. Did have history of laparoscopic cholecystectomy 3 years ago by Dr. Maia Plan and she did well.  She did have both a CT scan as well as an MRI that have personally reviewed.  There is no evidence of complications related to her gallbladder surgery there is no evidence of hernia serous no evidence of acute appendicitis or any other acute intra-abdominal pathology, fatty liver.  Does have UA consistent with UTI. CBC shows mild ovation white count to 16.  She has had chronic elevation of her white count.  Rest of her lab work including a CMP is normal.  HPI  Past Medical History:  Diagnosis Date  . Abdominal pain   . Anxiety   . Asthma   . Cough   . Diabetes mellitus without complication (HCC)   . Fatty liver   . Hypothyroid   . Obesity   . PCOS (polycystic ovarian syndrome)   . Sleep apnea   . Thyroid disease     Past Surgical History:  Procedure Laterality Date  . CHOLECYSTECTOMY N/A 10/06/2018   Procedure: LAPAROSCOPIC CHOLECYSTECTOMY;  Surgeon: Carolan Shiver, MD;  Location: [facility] ORS;  Service: General;  Laterality: N/A;  . hypothyroidisn    . pcos    . UVULOPALATOPHARYNGOPLASTY    . WISDOM TOOTH EXTRACTION      Family History  Problem Relation Age of Onset  . Diabetes Maternal Grandmother   . Hypertension Maternal Grandmother   . Kidney disease Maternal Grandmother   . Lung cancer Maternal Grandmother   . Pulmonary embolism  Maternal Grandmother     Social History Social History   Tobacco Use  . Smoking status: Never Smoker  . Smokeless tobacco: Never Used  Vaping Use  . Vaping Use: Every day  . Start date: 09/26/2018  . Substances: Nicotine, Flavoring  Substance Use Topics  . Alcohol use: Yes    Comment: Rare  . Drug use: No    Allergies  Allergen Reactions  . Shellfish-Derived Products Nausea And Vomiting    No current facility-administered medications for this encounter.   Current Outpatient Medications  Medication Sig Dispense Refill  . cephALEXin (KEFLEX) 500 MG capsule Take 1 capsule (500 mg total) by mouth 4 (four) times daily for 7 days. 28 capsule 0  . Drospirenone (SLYND) 4 MG TABS Take 1 tablet by mouth at bedtime. 28 tablet 3  . Levothyroxine Sodium 50 MCG CAPS Take 50 mcg by mouth daily before breakfast.  11     Review of Systems Full ROS  was asked and was negative except for the information on the HPI  Physical Exam Blood pressure (!) 94/53, pulse 67, temperature 98.4 F (36.9 C), temperature source Oral, resp. rate 17, height 5\' 5"  (1.651 m), weight 117.9 kg, last menstrual period 12/20/2020, SpO2 94 %. CONSTITUTIONAL: NAD BMI 43 EYES: Pupils are equal, round,  Sclera are non-icteric. EARS, NOSE, MOUTH AND THROAT: She is  wearing a mask. Hearing is intact to voice. LYMPH NODES:  Lymph nodes in the neck are normal. RESPIRATORY:  Lungs are clear. There is normal respiratory effort, with equal breath sounds bilaterally, and without pathologic use of accessory muscles. CARDIOVASCULAR: Heart is regular without murmurs, gallops, or rubs. GI: The abdomen is soft, mild tenderness to palpation in the right lower quadrant and pelvic area.  No peritonitis. There are no palpable masses. There is no hepatosplenomegaly. There are normal bowel sounds in all quadrants. GU: Rectal deferred.   MUSCULOSKELETAL: Normal muscle strength and tone. No cyanosis or edema.   SKIN: Turgor is good and  there are no pathologic skin lesions or ulcers. NEUROLOGIC: Motor and sensation is grossly normal. Cranial nerves are grossly intact. PSYCH:  Oriented to person, place and time. Affect is normal.  Data Reviewed  I have personally reviewed the patient's imaging, laboratory findings and medical records.    Assessment/Plan  20 year old female with a BMI of 43  and PCOS comes in with abdominal pain.  Both CT and MRI failed to show any acute intra-abdominal process.  This can certainly be caused by UTI or pelvic pain from PCOS.  Currently there is no evidence of an acute surgical issue at this time.  We will be available if needed.  1 alternative is to do admission to the hospital for observation by the hospitalist service  if the symptoms persist. Be happy to follow her in my office as an outpatient.  She may also need gynecological consultation and initial GI consultation if symptoms persist.  Patient discussed with the patient and with Dr. Charna Elizabeth, MD FACS General Surgeon 12/21/2020, 10:34 AM

## 2020-12-21 NOTE — ED Provider Notes (Signed)
[facility] Emergency Department Provider Note  ____________________________________________   Event Date/Time   First MD Initiated Contact with Patient 12/21/20 [phone]     (approximate)  I have reviewed the triage vital signs and the nursing notes.   HISTORY  Chief Complaint Abdominal Pain    HPI Connie Romero is a 20 y.o. female with prior gallbladder removal, PCOS who comes in with right lower quadrant pain.  Patient reports right lower quadrant pain since yesterday, severe, constant, nothing makes it better, worse with movement.  Patient denies any nausea, vomiting, diarrhea, fevers, dysuria.  She denies any vaginal discharge or new sexual partners to the be concerned about STDs.       Past Medical History:  Diagnosis Date  . Abdominal pain   . Anxiety   . Asthma   . Cough   . Diabetes mellitus without complication (HCC)   . Fatty liver   . Hypothyroid   . Obesity   . PCOS (polycystic ovarian syndrome)   . Sleep apnea   . Thyroid disease     Patient Active Problem List   Diagnosis Date Noted  . Polycystic ovarian syndrome 05/19/2017  . Mild obstructive sleep apnea 12/01/2016  . Morbid obesity (HCC) 12/01/2016  . PVC (premature ventricular contraction) 09/17/2016  . Acquired hypothyroidism 07/15/2016    Past Surgical History:  Procedure Laterality Date  . CHOLECYSTECTOMY N/A 10/06/2018   Procedure: LAPAROSCOPIC CHOLECYSTECTOMY;  Surgeon: Carolan Shiver, MD;  Location: [facility] ORS;  Service: General;  Laterality: N/A;  . hypothyroidisn    . pcos    . UVULOPALATOPHARYNGOPLASTY    . WISDOM TOOTH EXTRACTION      Prior to Admission medications   Medication Sig Start Date End Date Taking? Authorizing Provider  Drospirenone (SLYND) 4 MG TABS Take 1 tablet by mouth at bedtime. 09/28/20   Linzie Collin, MD  Levothyroxine Sodium 50 MCG CAPS Take 50 mcg by mouth daily before breakfast. 04/13/18   [provider]     Allergies Shellfish-derived products  Family History  Problem Relation Age of Onset  . Diabetes Maternal Grandmother   . Hypertension Maternal Grandmother   . Kidney disease Maternal Grandmother   . Lung cancer Maternal Grandmother   . Pulmonary embolism Maternal Grandmother     Social History Social History   Tobacco Use  . Smoking status: Never Smoker  . Smokeless tobacco: Never Used  Vaping Use  . Vaping Use: Every day  . Start date: 09/26/2018  . Substances: Nicotine, Flavoring  Substance Use Topics  . Alcohol use: Yes    Comment: Rare  . Drug use: No      Review of Systems Constitutional: No fever/chills Eyes: No visual changes. ENT: No sore throat. Cardiovascular: Denies chest pain. Respiratory: Denies shortness of breath. Gastrointestinal: Positive abdominal pain.  No nausea, no vomiting.  No diarrhea.  No constipation. Genitourinary: Negative for dysuria. Musculoskeletal: Negative for back pain. Skin: Negative for rash. Neurological: Negative for headaches, focal weakness or numbness. All other ROS negative ____________________________________________   PHYSICAL EXAM:  VITAL SIGNS: ED Triage Vitals  Enc Vitals Group     BP 12/20/20 2226 (!) 146/84     Pulse Rate 12/20/20 2226 94     Resp 12/20/20 2226 20     Temp 12/20/20 2226 98.4 F (36.9 C)     Temp Source 12/20/20 2226 Oral     SpO2 12/20/20 2226 96 %     Weight 12/20/20 2225 260 lb (117.9  kg)     Height 12/20/20 2225 5\' 5"  (1.651 m)     Head Circumference --      Peak Flow --      Pain Score 12/20/20 2225 10     Pain Loc --      Pain Edu? --      Excl. in GC? --     Constitutional: Alert and oriented. Well appearing and in no acute distress. Eyes: Conjunctivae are normal. EOMI. Head: Atraumatic. Nose: No congestion/rhinnorhea. Mouth/Throat: Mucous membranes are moist.   Neck: No stridor. Trachea Midline. FROM Cardiovascular: Normal rate, regular rhythm. Grossly normal heart  sounds.  Good peripheral circulation. Respiratory: Normal respiratory effort.  No retractions. Lungs CTAB. Gastrointestinal: Tender in the right lower quadrant without any rebound or guarding no distention. No abdominal bruits.  Musculoskeletal: No lower extremity tenderness nor edema.  No joint effusions. Neurologic:  Normal speech and language. No gross focal neurologic deficits are appreciated.  Skin:  Skin is warm, dry and intact. No rash noted. Psychiatric: Mood and affect are normal. Speech and behavior are normal. GU: Deferred   ____________________________________________   LABS (all labs ordered are listed, but only abnormal results are displayed)  Labs Reviewed  CBC WITH DIFFERENTIAL/PLATELET - Abnormal; Notable for the following components:      Result Value   WBC 16.4 (*)    Neutro Abs 10.3 (*)    Lymphs Abs 4.7 (*)    All other components within normal limits  COMPREHENSIVE METABOLIC PANEL - Abnormal; Notable for the following components:   Total Protein 8.2 (*)    All other components within normal limits  LIPASE, BLOOD  URINALYSIS, COMPLETE (UACMP) WITH MICROSCOPIC  POC URINE PREG, ED   ____________________________________________    RADIOLOGY   Official radiology report(s): CT ABDOMEN PELVIS WO CONTRAST  Result Date: 12/21/2020 CLINICAL DATA:  Right lower quadrant abdominal pain. Worse when marks removes. EXAM: CT ABDOMEN AND PELVIS WITHOUT CONTRAST TECHNIQUE: Multidetector CT imaging of the abdomen and pelvis was performed following the standard protocol without IV contrast. COMPARISON:  CT abdomen pelvis 10/23/2019, CT abdomen pelvis 11/08/2026 FINDINGS: Lower chest: No acute abnormality. Hepatobiliary: Liver is enlarged measuring up to 22 cm. No focal liver abnormality. Status post cholecystectomy. No biliary dilatation. Pancreas: No focal lesion. Normal pancreatic contour. No surrounding inflammatory changes. No main pancreatic ductal dilatation. Spleen:  Normal in size without focal abnormality. Adrenals/Urinary Tract: No adrenal nodule bilaterally. No nephrolithiasis, no hydronephrosis, and no contour-deforming renal mass. No ureterolithiasis or hydroureter. The urinary bladder is unremarkable. Stomach/Bowel: Stomach is within normal limits. No evidence of bowel wall thickening or dilatation. Appendix appears normal. Vascular/Lymphatic: No abdominal aorta or iliac aneurysm. Unchanged prominent mesenteric and retroperitoneal lymph nodes. No abdominal, pelvic, or inguinal lymphadenopathy. Reproductive: Uterus and bilateral adnexa are unremarkable. Other: No intraperitoneal free fluid. No intraperitoneal free gas. No organized fluid collection. Musculoskeletal: No abdominal wall hernia or abnormality. No suspicious lytic or blastic osseous lesions. No acute displaced fracture. IMPRESSION: 1. The appendix is definitely not identified. No right lower quadrant inflammatory changes to indicate acute appendicitis. 2. Hepatomegaly. Electronically Signed   By: 11/10/2026 M.D.   On: 12/21/2020 04:57    ____________________________________________   PROCEDURES  Procedure(s) performed (including Critical Care):  Procedures   ____________________________________________   INITIAL IMPRESSION / ASSESSMENT AND PLAN / ED COURSE  Connie Romero was evaluated in Emergency Department on 12/21/2020 for the symptoms described in the history of present illness. She was evaluated in the context  of the global COVID-19 pandemic, which necessitated consideration that the patient might be at risk for infection with the SARS-CoV-2 virus that causes COVID-19. Institutional protocols and algorithms that pertain to the evaluation of patients at risk for COVID-19 are in a state of rapid change based on information released by regulatory bodies including the CDC and federal and state organizations. These policies and algorithms were followed during the patient's care in the ED.     Patient comes in with right lower quadrant abdominal pain.  Will get CT scan to evaluate for appendicitis, diverticulitis, perforation or other acute pathology.  I am more concerned about appendicitis and ovarian issue given patient's elevated white count but will also use a CT to evaluate her ovary.  She denies symptoms to suggest PID.  Give a dose of IV Dilaudid and IV Zofran while waiting was results.  Will get pregnancy test   Pregnancy test is negative.  Patient on reevaluation has continued pain in her right lower quadrant, 7 out of 10.  Her CT scan is without evidence of signs of appendicitis but they are unable to visualize the appendix.  She has normal adnexa bilaterally see it seems not likely to be related to her ovaries and she had denied any symptoms to suggest PID.  Given her continued pain will discuss with surgery.  Discussed the case with Dr. Everlene Farrier from surgery who recommended MRI to further evaluate.  We will give patient additional dose of IV Dilaudid while pending MRI.  Again discussed with patient she continues to decline pelvic exam given she does not have any vaginal discharge or new partners   ____________________________________________   FINAL CLINICAL IMPRESSION(S) / ED DIAGNOSES   Final diagnoses:  RLQ abdominal pain      MEDICATIONS GIVEN DURING THIS VISIT:  Medications  HYDROmorphone (DILAUDID) injection 0.5 mg (has no administration in time range)  HYDROmorphone (DILAUDID) injection 0.5 mg (0.5 mg Intravenous Given 12/21/20 0400)  ondansetron (ZOFRAN) injection 4 mg (4 mg Intravenous Given 12/21/20 0359)     ED Discharge Orders    None       Note:  This document was prepared using Dragon voice recognition software and may include unintentional dictation errors.   Concha Se, MD 12/21/20 [phone]

## 2020-12-21 NOTE — ED Notes (Signed)
Pt ambulatory to restroom with steady gait, appears to be in pain while walking - observed guarding her right lower abdomen. Assisted from restroom to wheelchair, to MRI.

## 2020-12-21 NOTE — ED Notes (Signed)
Waiting for ride in ED rm 11.

## 2020-12-21 NOTE — ED Provider Notes (Signed)
I assumed care of this patient approximately 0 700.  Please see outgoing providers note for full details regarding patient's initial evaluation assessment.  In brief patient presents for assessment of some right lower quadrant abdominal pain.  CT abdomen pelvis is unremarkable the patient has a slight leukocytosis.  Patient discussed with on-call surgeon who recommended MRI to further assess for appendicitis.  MRI without evidence of appendicitis.  UA is concerning for cystitis.  Patient given dose of Rocephin in the emergency room and urine culture sent.  My assessment patient Dors is some persistent pain she was given some Toradol given otherwise stable vital signs with reassuring CT and MRI think she is safe for discharge with outpatient follow-up.  Rx written for Keflex.  Discharged stable condition.  Strict return cautions advised and discussed.    Gilles Chiquito, MD 12/21/20 [phone]

## 2020-12-23 LAB — URINE CULTURE: Culture: >=100000 — AB

## 2021-01-10 ENCOUNTER — Encounter: Payer: Medicaid Other | Admitting: Obstetrics and Gynecology

## 2021-01-17 ENCOUNTER — Encounter: Payer: Medicaid Other | Admitting: Obstetrics and Gynecology

## 2021-01-18 ENCOUNTER — Ambulatory Visit: Payer: Medicaid Other | Attending: Neurology

## 2021-01-18 DIAGNOSIS — G4730 Sleep apnea, unspecified: Secondary | ICD-10-CM | POA: Diagnosis present

## 2021-01-18 DIAGNOSIS — R0683 Snoring: Secondary | ICD-10-CM | POA: Insufficient documentation

## 2021-01-21 ENCOUNTER — Other Ambulatory Visit: Payer: Self-pay

## 2021-01-31 ENCOUNTER — Encounter: Payer: Self-pay | Admitting: Obstetrics and Gynecology

## 2021-02-11 ENCOUNTER — Encounter: Payer: Self-pay | Admitting: Obstetrics and Gynecology

## 2021-03-12 ENCOUNTER — Emergency Department
Admission: EM | Admit: 2021-03-12 | Discharge: 2021-03-12 | Disposition: A | Discharge status: Home / Self Care | Payer: Medicaid Other | Attending: Emergency Medicine | Admitting: Emergency Medicine

## 2021-03-12 ENCOUNTER — Emergency Department: Payer: Medicaid Other

## 2021-03-12 ENCOUNTER — Encounter: Payer: Self-pay | Admitting: Emergency Medicine

## 2021-03-12 ENCOUNTER — Other Ambulatory Visit: Payer: Self-pay

## 2021-03-12 DIAGNOSIS — E119 Type 2 diabetes mellitus without complications: Secondary | ICD-10-CM | POA: Insufficient documentation

## 2021-03-12 DIAGNOSIS — R1030 Lower abdominal pain, unspecified: Secondary | ICD-10-CM | POA: Insufficient documentation

## 2021-03-12 DIAGNOSIS — F17200 Nicotine dependence, unspecified, uncomplicated: Secondary | ICD-10-CM | POA: Diagnosis not present

## 2021-03-12 DIAGNOSIS — Z79899 Other long term (current) drug therapy: Secondary | ICD-10-CM | POA: Insufficient documentation

## 2021-03-12 DIAGNOSIS — J45909 Unspecified asthma, uncomplicated: Secondary | ICD-10-CM | POA: Diagnosis not present

## 2021-03-12 DIAGNOSIS — E039 Hypothyroidism, unspecified: Secondary | ICD-10-CM | POA: Diagnosis not present

## 2021-03-12 DIAGNOSIS — R1033 Periumbilical pain: Secondary | ICD-10-CM

## 2021-03-12 LAB — URINALYSIS, COMPLETE (UACMP) WITH MICROSCOPIC
Bacteria, UA: NONE SEEN
Bilirubin Urine: NEGATIVE
Glucose, UA: NEGATIVE mg/dL
Hgb urine dipstick: NEGATIVE
Ketones, ur: NEGATIVE mg/dL
Nitrite: NEGATIVE
Protein, ur: NEGATIVE mg/dL
Specific Gravity, Urine: 1.023 (ref 1.005–1.030)
pH: 5 (ref 5.0–8.0)

## 2021-03-12 LAB — CBC
HCT: 38.9 % (ref 36.0–46.0)
Hemoglobin: 13 g/dL (ref 12.0–15.0)
MCH: 27.7 pg (ref 26.0–34.0)
MCHC: 33.4 g/dL (ref 30.0–36.0)
MCV: 82.9 fL (ref 80.0–100.0)
Platelets: 344 10*3/uL (ref 150–400)
RBC: 4.69 MIL/uL (ref 3.87–5.11)
RDW: 14 % (ref 11.5–15.5)
WBC: 11 10*3/uL — ABNORMAL HIGH (ref 4.0–10.5)
nRBC: 0 % (ref 0.0–0.2)

## 2021-03-12 LAB — COMPREHENSIVE METABOLIC PANEL
ALT: 23 U/L (ref 0–44)
AST: 17 U/L (ref 15–41)
Albumin: 3.9 g/dL (ref 3.5–5.0)
Alkaline Phosphatase: 61 U/L (ref 38–126)
Anion gap: 8 (ref 5–15)
BUN: 7 mg/dL (ref 6–20)
CO2: 25 mmol/L (ref 22–32)
Calcium: 9.1 mg/dL (ref 8.9–10.3)
Chloride: 103 mmol/L (ref 98–111)
Creatinine, Ser: 0.62 mg/dL (ref 0.44–1.00)
GFR, Estimated: >60 mL/min (ref >60)
Glucose, Bld: 107 mg/dL — ABNORMAL HIGH (ref 70–99)
Potassium: 4 mmol/L (ref 3.5–5.1)
Sodium: 136 mmol/L (ref 135–145)
Total Bilirubin: 0.4 mg/dL (ref 0.3–1.2)
Total Protein: 7.9 g/dL (ref 6.5–8.1)

## 2021-03-12 LAB — LIPASE, BLOOD: Lipase: 26 U/L (ref 11–51)

## 2021-03-12 LAB — POC URINE PREG, ED: Preg Test, Ur: NEGATIVE

## 2021-03-12 MED ORDER — KETOROLAC TROMETHAMINE 30 MG/ML IJ SOLN
30.0000 mg | Freq: Once | INTRAMUSCULAR | Status: AC
  Administered 2021-03-12: 30 mg via INTRAMUSCULAR
  Filled 2021-03-12: qty 1

## 2021-03-12 MED ORDER — TRAMADOL HCL 50 MG PO TABS: 50.0000 mg | ORAL_TABLET | Freq: Four times a day (QID) | ORAL | 0 refills | Status: DC | PRN

## 2021-03-12 NOTE — ED Provider Notes (Signed)
[facility] Emergency Department Provider Note   ____________________________________________    I have reviewed the triage vital signs and the nursing notes.   HISTORY  Chief Complaint Abdominal Pain     HPI Connie Romero is a 20 y.o. female with history as noted below including diabetes who presents with complaints of lower abdominal pain.  Patient reports this has been ongoing for nearly a week and seems to have worsened over the last 48 hours.  She went to [facility] last night, apparently had pelvic exam and urinalysis which was unremarkable.  Notes that she has had this pain intermittently for months, review of medical records demonstrates CT scan and MRI performed on May 27 at our hospital which were negative for appendicitis.  Denies dysuria.  Past Medical History:  Diagnosis Date   Abdominal pain    Anxiety    Asthma    Cough    Diabetes mellitus without complication (HCC)    Fatty liver    Hypothyroid    Obesity    PCOS (polycystic ovarian syndrome)    Sleep apnea    Thyroid disease     Patient Active Problem List   Diagnosis Date Noted   Polycystic ovarian syndrome 05/19/2017   Mild obstructive sleep apnea 12/01/2016   Morbid obesity (HCC) 12/01/2016   PVC (premature ventricular contraction) 09/17/2016   Acquired hypothyroidism 07/15/2016    Past Surgical History:  Procedure Laterality Date   CHOLECYSTECTOMY N/A 10/06/2018   Procedure: LAPAROSCOPIC CHOLECYSTECTOMY;  Surgeon: Carolan Shiver, MD;  Location: [facility] ORS;  Service: General;  Laterality: N/A;   hypothyroidisn     pcos     UVULOPALATOPHARYNGOPLASTY     WISDOM TOOTH EXTRACTION      Prior to Admission medications   Medication Sig Start Date End Date Taking? Authorizing Provider  Drospirenone (SLYND) 4 MG TABS Take 1 tablet by mouth at bedtime. 09/28/20   Linzie Collin, MD  Levothyroxine Sodium 50 MCG CAPS Take 50 mcg by mouth daily before  breakfast. 04/13/18   [provider]     Allergies Shellfish-derived products  Family History  Problem Relation Age of Onset   Diabetes Maternal Grandmother    Hypertension Maternal Grandmother    Kidney disease Maternal Grandmother    Lung cancer Maternal Grandmother    Pulmonary embolism Maternal Grandmother     Social History Social History   Tobacco Use   Smoking status: Never   Smokeless tobacco: Never  Vaping Use   Vaping Use: Every day   Start date: 09/26/2018   Substances: Nicotine, Flavoring  Substance Use Topics   Alcohol use: Yes    Comment: Rare   Drug use: No    Review of Systems  Constitutional: No fever/chills Eyes: No visual changes.  ENT: No sore throat. Cardiovascular: Denies chest pain. Respiratory: Denies shortness of breath. Gastrointestinal: As above Genitourinary: Negative for dysuria. Musculoskeletal: Negative for back pain. Skin: Negative for rash. Neurological: Negative for headaches or weakness   ____________________________________________   PHYSICAL EXAM:  VITAL SIGNS: ED Triage Vitals  Enc Vitals Group     BP 03/12/21 1747 124/83     Pulse Rate 03/12/21 1747 85     Resp 03/12/21 1747 20     Temp 03/12/21 1747 98.3 F (36.8 C)     Temp Source 03/12/21 1747 Oral     SpO2 03/12/21 1747 99 %     Weight 03/12/21 1745 117.9 kg (260 lb)  Height 03/12/21 1745 1.626 m (5\' 4" )     Head Circumference --      Peak Flow --      Pain Score 03/12/21 1745 10     Pain Loc --      Pain Edu? --      Excl. in GC? --     Constitutional: Alert and oriented. No acute distress.   Nose: No congestion/rhinnorhea. Mouth/Throat: Mucous membranes are moist.    Cardiovascular: Normal rate, regular rhythm. Grossly normal heart sounds.  Good peripheral circulation. Respiratory: Normal respiratory effort.  No retractions. Lungs CTAB. Gastrointestinal: Mild tenderness suprapubically and right lower quadrant, no significant distention  no CVA tenderness. Genitourinary: deferred Musculoskeletal: No lower extremity tenderness nor edema.  Warm and well perfused Neurologic:  Normal speech and language. No gross focal neurologic deficits are appreciated.  Skin:  Skin is warm, dry and intact. No rash noted. Psychiatric: Mood and affect are normal. Speech and behavior are normal.  ____________________________________________   LABS (all labs ordered are listed, but only abnormal results are displayed)  Labs Reviewed  COMPREHENSIVE METABOLIC PANEL - Abnormal; Notable for the following components:      Result Value   Glucose, Bld 107 (*)    All other components within normal limits  CBC - Abnormal; Notable for the following components:   WBC 11.0 (*)    All other components within normal limits  URINALYSIS, COMPLETE (UACMP) WITH MICROSCOPIC - Abnormal; Notable for the following components:   Color, Urine YELLOW (*)    APPearance HAZY (*)    Leukocytes,Ua SMALL (*)    All other components within normal limits  LIPASE, BLOOD  POC URINE PREG, ED   ____________________________________________  EKG   ____________________________________________  RADIOLOGY  CT renal stone study reviewed by me, no acute abnormality ____________________________________________   PROCEDURES  Procedure(s) performed: No  Procedures   Critical Care performed: No ____________________________________________   INITIAL IMPRESSION / ASSESSMENT AND PLAN / ED COURSE  Pertinent labs & imaging results that were available during my care of the patient were reviewed by me and considered in my medical decision making (see chart for details).   Patient presents with lower abdominal pain, initial differential includes premenstrual cramping ,ureterolithiasis, urinary tract infection, appendicitis.  However she notes that she has had this intermittently before, pain appears to be primarily suprapubic urinalysis demonstrates small leuks but no  bacteria seen.  Lab work demonstrates very mild elevation of white blood cell count  Will give IM Toradol, sent for CT renal stone study  CT scan is unremarkable, normal appendix, no ureterolithiasis, no evidence of ovarian cysts patient improved after IM Toradol, will discharge with outpatient follow-up with GI    ____________________________________________   FINAL CLINICAL IMPRESSION(S) / ED DIAGNOSES  Final diagnoses:  Periumbilical abdominal pain        Note:  This document was prepared using Dragon voice recognition software and may include unintentional dictation errors.    03/14/21, MD 03/12/21 03/14/21

## 2021-03-12 NOTE — ED Notes (Signed)
See triage note   Presents with lower abd pain  Pain is mainly on the right  Positive nausea  No fever

## 2021-03-30 ENCOUNTER — Emergency Department: Payer: Medicaid Other

## 2021-03-30 ENCOUNTER — Emergency Department
Admission: EM | Admit: 2021-03-30 | Discharge: 2021-03-30 | Disposition: A | Discharge status: Home / Self Care | Payer: Medicaid Other | Attending: Student in an Organized Health Care Education/Training Program | Admitting: Student in an Organized Health Care Education/Training Program

## 2021-03-30 ENCOUNTER — Other Ambulatory Visit: Payer: Self-pay

## 2021-03-30 DIAGNOSIS — Z79899 Other long term (current) drug therapy: Secondary | ICD-10-CM | POA: Diagnosis not present

## 2021-03-30 DIAGNOSIS — R0781 Pleurodynia: Secondary | ICD-10-CM | POA: Diagnosis not present

## 2021-03-30 DIAGNOSIS — E039 Hypothyroidism, unspecified: Secondary | ICD-10-CM | POA: Diagnosis not present

## 2021-03-30 DIAGNOSIS — J45909 Unspecified asthma, uncomplicated: Secondary | ICD-10-CM | POA: Insufficient documentation

## 2021-03-30 DIAGNOSIS — R0789 Other chest pain: Secondary | ICD-10-CM

## 2021-03-30 DIAGNOSIS — E119 Type 2 diabetes mellitus without complications: Secondary | ICD-10-CM | POA: Diagnosis not present

## 2021-03-30 LAB — BASIC METABOLIC PANEL
Anion gap: 5 (ref 5–15)
BUN: 7 mg/dL (ref 6–20)
CO2: 27 mmol/L (ref 22–32)
Calcium: 8.8 mg/dL — ABNORMAL LOW (ref 8.9–10.3)
Chloride: 106 mmol/L (ref 98–111)
Creatinine, Ser: 0.65 mg/dL (ref 0.44–1.00)
GFR, Estimated: >60 mL/min (ref >60)
Glucose, Bld: 101 mg/dL — ABNORMAL HIGH (ref 70–99)
Potassium: 3.8 mmol/L (ref 3.5–5.1)
Sodium: 138 mmol/L (ref 135–145)

## 2021-03-30 LAB — CBC
HCT: 37.5 % (ref 36.0–46.0)
Hemoglobin: 12.3 g/dL (ref 12.0–15.0)
MCH: 27.8 pg (ref 26.0–34.0)
MCHC: 32.8 g/dL (ref 30.0–36.0)
MCV: 84.8 fL (ref 80.0–100.0)
Platelets: 339 10*3/uL (ref 150–400)
RBC: 4.42 MIL/uL (ref 3.87–5.11)
RDW: 14 % (ref 11.5–15.5)
WBC: 12.7 10*3/uL — ABNORMAL HIGH (ref 4.0–10.5)
nRBC: 0 % (ref 0.0–0.2)

## 2021-03-30 LAB — TROPONIN I (HIGH SENSITIVITY): Troponin I (High Sensitivity): 3 ng/L (ref <18)

## 2021-03-30 LAB — D-DIMER, QUANTITATIVE: D-Dimer, Quant: 0.34 ug/mL-FEU (ref 0.00–0.50)

## 2021-03-30 MED ORDER — ALUM & MAG HYDROXIDE-SIMETH 200-200-20 MG/5ML PO SUSP
30.0000 mL | Freq: Once | ORAL | Status: AC
  Administered 2021-03-30: 30 mL via ORAL
  Filled 2021-03-30: qty 30

## 2021-03-30 MED ORDER — LIDOCAINE VISCOUS HCL 2 % MT SOLN
15.0000 mL | Freq: Once | OROMUCOSAL | Status: AC
  Administered 2021-03-30: 15 mL via ORAL
  Filled 2021-03-30: qty 15

## 2021-03-30 MED ORDER — ACETAMINOPHEN 325 MG PO TABS
650.0000 mg | ORAL_TABLET | Freq: Once | ORAL | Status: AC
  Administered 2021-03-30: 650 mg via ORAL
  Filled 2021-03-30: qty 2

## 2021-03-30 NOTE — ED Triage Notes (Signed)
Pt states that her L side of her chest has been hurting since 0600 with pain now radiating into jaw- pt states when she got up to use the bathroom this morning, she almost fainted and had been dizzy and nauseous since

## 2021-03-30 NOTE — ED Provider Notes (Signed)
  Physical Exam  BP (!) 101/46   Pulse (!) 58   Temp 98.8 F (37.1 C) (Oral)   Resp 17   Ht 5\' 5"  (1.651 m)   Wt 120.2 kg   SpO2 99%   BMI 44.10 kg/m   Physical Exam  ED Course/Procedures     Procedures  MDM   Assumed patient care from , MD.  D-dimer within reference range.  Upon recheck, patient stated that her pain improved and she anticipated being discharged.  She had no additional questions.       Willy Eddy [place], [place] 03/30/21 2207    2208, MD 03/31/21 1500

## 2021-03-30 NOTE — ED Provider Notes (Signed)
[facility] Emergency Department Provider Note    Event Date/Time   First MD Initiated Contact with Patient 03/30/21 1645     (approximate)  I have reviewed the triage vital signs and the nursing notes.   HISTORY  Chief Complaint Chest Pain    HPI Connie Romero is a 20 y.o. female below listed past medical history presents to the ER for evaluation of left-sided chest pain and pressure feeling some sitting on her chest.  Started today.  Does have some discomfort with deep inspiration.  No palpitations no fevers.  No chills.  No abdominal pain.  No nausea or vomiting.  No aggravating or alleviating factors.  Past Medical History:  Diagnosis Date   Abdominal pain    Anxiety    Asthma    Cough    Diabetes mellitus without complication (HCC)    Fatty liver    Hypothyroid    Obesity    PCOS (polycystic ovarian syndrome)    Sleep apnea    Thyroid disease    Family History  Problem Relation Age of Onset   Diabetes Maternal Grandmother    Hypertension Maternal Grandmother    Kidney disease Maternal Grandmother    Lung cancer Maternal Grandmother    Pulmonary embolism Maternal Grandmother    Past Surgical History:  Procedure Laterality Date   CHOLECYSTECTOMY N/A 10/06/2018   Procedure: LAPAROSCOPIC CHOLECYSTECTOMY;  Surgeon: Carolan Shiver, MD;  Location: [facility] ORS;  Service: General;  Laterality: N/A;   hypothyroidisn     pcos     UVULOPALATOPHARYNGOPLASTY     WISDOM TOOTH EXTRACTION     Patient Active Problem List   Diagnosis Date Noted   Polycystic ovarian syndrome 05/19/2017   Mild obstructive sleep apnea 12/01/2016   Morbid obesity (HCC) 12/01/2016   PVC (premature ventricular contraction) 09/17/2016   Acquired hypothyroidism 07/15/2016      Prior to Admission medications   Medication Sig Start Date End Date Taking? Authorizing Provider  Drospirenone (SLYND) 4 MG TABS Take 1 tablet by mouth at bedtime. 09/28/20   Linzie Collin, MD  Levothyroxine Sodium 50 MCG CAPS Take 50 mcg by mouth daily before breakfast. 04/13/18   [provider]  traMADol (ULTRAM) 50 MG tablet Take 1 tablet (50 mg total) by mouth every 6 (six) hours as needed. 03/12/21 03/12/22  Jene Every, MD    Allergies Shellfish-derived products    Social History Social History   Tobacco Use   Smoking status: Never   Smokeless tobacco: Never  Vaping Use   Vaping Use: Every day   Start date: 09/26/2018   Substances: Nicotine, Flavoring  Substance Use Topics   Alcohol use: Yes    Comment: Rare   Drug use: No    Review of Systems Patient denies headaches, rhinorrhea, blurry vision, numbness, shortness of breath, chest pain, edema, cough, abdominal pain, nausea, vomiting, diarrhea, dysuria, fevers, rashes or hallucinations unless otherwise stated above in HPI. ____________________________________________   PHYSICAL EXAM:  VITAL SIGNS: Vitals:   03/30/21 1435 03/30/21 1816  BP: 121/72 (!) 101/46  Pulse: 73 (!) 58  Resp: 18 17  Temp: 98.8 F (37.1 C)   SpO2: 97% 99%    Constitutional: Alert and oriented.  Eyes: Conjunctivae are normal.  Head: Atraumatic. Nose: No congestion/rhinnorhea. Mouth/Throat: Mucous membranes are moist.   Neck: No stridor. Painless ROM.  Cardiovascular: Normal rate, regular rhythm. Grossly normal heart sounds.  Good peripheral circulation. Respiratory: Normal respiratory effort.  No retractions. Lungs  CTAB. Gastrointestinal: Soft and nontender. No distention. No abdominal bruits. No CVA tenderness. Genitourinary:  Musculoskeletal: pain reproduced with palpation of left anterior chest wall.  No lower extremity tenderness nor edema.  No joint effusions. Neurologic:  Normal speech and language. No gross focal neurologic deficits are appreciated. No facial droop Skin:  Skin is warm, dry and intact. No rash noted. Psychiatric: Mood and affect are normal. Speech and behavior are  normal.  ____________________________________________   LABS (all labs ordered are listed, but only abnormal results are displayed)  Results for orders placed or performed during the hospital encounter of 03/30/21 (from the past 24 hour(s))  Basic metabolic panel     Status: Abnormal   Collection Time: 03/30/21  2:36 PM  Result Value Ref Range   Sodium 138 135 - 145 mmol/L   Potassium 3.8 3.5 - 5.1 mmol/L   Chloride 106 98 - 111 mmol/L   CO2 27 22 - 32 mmol/L   Glucose, Bld 101 (H) 70 - 99 mg/dL   BUN 7 6 - 20 mg/dL   Creatinine, Ser 6.44 0.44 - 1.00 mg/dL   Calcium 8.8 (L) 8.9 - 10.3 mg/dL   GFR, Estimated >03 >47 mL/min   Anion gap 5 5 - 15  Troponin I (High Sensitivity)     Status: None   Collection Time: 03/30/21  2:36 PM  Result Value Ref Range   Troponin I (High Sensitivity) 3 <18 ng/L  CBC     Status: Abnormal   Collection Time: 03/30/21  6:27 PM  Result Value Ref Range   WBC 12.7 (H) 4.0 - 10.5 K/uL   RBC 4.42 3.87 - 5.11 MIL/uL   Hemoglobin 12.3 12.0 - 15.0 g/dL   HCT 42.5 95.6 - 38.7 %   MCV 84.8 80.0 - 100.0 fL   MCH 27.8 26.0 - 34.0 pg   MCHC 32.8 30.0 - 36.0 g/dL   RDW 56.4 33.2 - 95.1 %   Platelets 339 150 - 400 K/uL   nRBC 0.0 0.0 - 0.2 %   ____________________________________________  EKG My review and personal interpretation at Time: 14:32   Indication: chest pain  Rate: 60  Rhythm: sinus Axis: normal Other: nonspecific st abn, no stemi, no depressions ____________________________________________  RADIOLOGY  I personally reviewed all radiographic images ordered to evaluate for the above acute complaints and reviewed radiology reports and findings.  These findings were personally discussed with the patient.  Please see medical record for radiology report.  ____________________________________________   PROCEDURES  Procedure(s) performed:  Procedures    Critical Care performed: no ____________________________________________   INITIAL  IMPRESSION / ASSESSMENT AND PLAN / ED COURSE  Pertinent labs & imaging results that were available during my care of the patient were reviewed by me and considered in my medical decision making (see chart for details).   DDX: ACS, pericarditis, esophagitis,  pe, dissection, pna, bronchitis, costochondritis   Connie Romero is a 20 y.o. who presents to the ED with reproducible anterior chest wall pain that is pleuritic in nature.  She is on birth control.  She is otherwise low risk by Wells criteria but will order D-dimer to further stratify for PE.  Have lower suspicion for ACS her EKG is nonischemic her troponin is negative.  Her abdominal exam is soft and benign.  Patient declining COVID testing I have a low suspicion for infectious etiology.  She denies any discharge or true breast pain.  No recent heavy lifting no injuries.  Chest x-ray otherwise  reassuring.  Patient be signed out to oncoming provider pending follow-up D dimer.  If negative anticipate discharge home for outpatient follow up.     The patient was evaluated in Emergency Department today for the symptoms described in the history of present illness. He/she was evaluated in the context of the global COVID-19 pandemic, which necessitated consideration that the patient might be at risk for infection with the SARS-CoV-2 virus that causes COVID-19. Institutional protocols and algorithms that pertain to the evaluation of patients at risk for COVID-19 are in a state of rapid change based on information released by regulatory bodies including the CDC and federal and state organizations. These policies and algorithms were followed during the patient's care in the ED.  As part of my medical decision making, I reviewed the following data within the electronic MEDICAL RECORD NUMBER Nursing notes reviewed and incorporated, Labs reviewed, notes from prior ED visits and [place] Controlled Substance  Database   ____________________________________________   FINAL CLINICAL IMPRESSION(S) / ED DIAGNOSES  Final diagnoses:  Atypical chest pain      NEW MEDICATIONS STARTED DURING THIS VISIT:  New Prescriptions   No medications on file     Note:  This document was prepared using Dragon voice recognition software and may include unintentional dictation errors.    Willy Eddy, MD 03/30/21 Norberta Keens

## 2021-05-13 ENCOUNTER — Ambulatory Visit (INDEPENDENT_AMBULATORY_CARE_PROVIDER_SITE_OTHER): Payer: Medicaid Other | Admitting: Gastroenterology

## 2021-05-13 ENCOUNTER — Encounter: Payer: Self-pay | Admitting: Gastroenterology

## 2021-05-13 ENCOUNTER — Other Ambulatory Visit: Payer: Self-pay

## 2021-05-13 VITALS — BP 112/72 | HR 69 | Temp 98.2°F | Ht 65.0 in | Wt 265.6 lb

## 2021-05-13 DIAGNOSIS — M7918 Myalgia, other site: Secondary | ICD-10-CM | POA: Diagnosis not present

## 2021-05-13 DIAGNOSIS — K582 Mixed irritable bowel syndrome: Secondary | ICD-10-CM | POA: Diagnosis not present

## 2021-05-13 MED ORDER — DICYCLOMINE HCL 20 MG PO TABS: 20.0000 mg | ORAL_TABLET | Freq: Three times a day (TID) | ORAL | 3 refills | Status: DC

## 2021-05-13 NOTE — Progress Notes (Signed)
Gastroenterology Consultation  Referring Provider:     [facility] Primary Care Physician:  [facility] Primary Gastroenterologist:  Dr. Servando Snare     Reason for Consultation:     Abdominal discomfort and alternating diarrhea and constipation        HPI:   Connie Romero is a 20 y.o. y/o female referred for consultation & management of Abdominal discomfort and alternating diarrhea and constipation by Dr. Theodoro Doing, [facility].  This patient comes in today after reporting that she's had right lower abdomen pain that has been evaluated with imaging and she states nobody could figure out what is going on with her abdominal pain.  The patient also states that she has diarrhea usually right after eating and she reports that the food runs right through her.  The patient has not lost any weight and denies any nausea or vomiting.  The patient does have chronic back pain and states that her abdominal pain is worse when she lays down and when she stands up.  2 long but not associated with eating drinking or bowel movements.  The patient also reports that she has been constipated for last 3 days.  The patient reports that it is very bothersome pain and that the only reason she went to the ER was because the pain was significant and she states that it has to be very bad prior to her going to the emergency room.  Despite that she had been in the emergency room on September 3, August 25, August 16, May 31st, May 27 and April 30 just this year. Most of these visits war for abdominal pain although one was due to a head injury and one for atypical chest pain.  The patient states she was seen by GYN who did not think that that was the cause of her pain and she also reports that she suffers from polycystic ovary syndrome. In the last 3 years the patient has had 4 CT scans And an MRI of the abdomen and pelvis. None of these study showed any cause for her symptoms.  Past Medical  History:  Diagnosis Date   Abdominal pain    Anxiety    Asthma    Cough    Diabetes mellitus without complication (HCC)    Fatty liver    Hypothyroid    Obesity    PCOS (polycystic ovarian syndrome)    Sleep apnea    Thyroid disease     Past Surgical History:  Procedure Laterality Date   CHOLECYSTECTOMY N/A 10/06/2018   Procedure: LAPAROSCOPIC CHOLECYSTECTOMY;  Surgeon: Carolan Shiver, MD;  Location: [facility] ORS;  Service: General;  Laterality: N/A;   hypothyroidisn     pcos     UVULOPALATOPHARYNGOPLASTY     WISDOM TOOTH EXTRACTION      Prior to Admission medications   Medication Sig Start Date End Date Taking? Authorizing Provider  dicyclomine (BENTYL) 20 MG tablet Take 1 tablet (20 mg total) by mouth 3 (three) times daily before meals. 05/13/21  Yes Mercie Balsley, MD  Drospirenone (SLYND) 4 MG TABS Take 1 tablet by mouth at bedtime. 09/28/20  Yes Linzie Collin, MD  Levothyroxine Sodium 50 MCG CAPS Take 50 mcg by mouth daily before breakfast. 04/13/18  Yes [provider]    Family History  Problem Relation Age of Onset   Diabetes Maternal Grandmother    Hypertension Maternal Grandmother    Kidney disease Maternal Grandmother    Lung cancer  Maternal Grandmother    Pulmonary embolism Maternal Grandmother      Social History   Tobacco Use   Smoking status: Never   Smokeless tobacco: Never  Vaping Use   Vaping Use: Every day   Start date: 09/26/2018   Substances: Nicotine, Flavoring  Substance Use Topics   Alcohol use: Yes    Comment: Rare   Drug use: No    Allergies as of 05/13/2021 - Review Complete 05/13/2021  Allergen Reaction Noted   Shellfish-derived products Nausea And Vomiting 11/04/2016    Review of Systems:    All systems reviewed and negative except where noted in HPI.   Physical Exam:  BP 112/72 (BP Location: Left Arm, Patient Position: Sitting, Cuff Size: Large)   Pulse 69   Temp 98.2 F (36.8 C) (Temporal)   Ht 5\' 5"  (1.651  m)   Wt 265 lb 9.6 oz (120.5 kg)   BMI 44.20 kg/m  No LMP recorded. General:   Alert,  Well-developed, well-nourished, pleasant and cooperative in NAD Head:  Normocephalic and atraumatic. Eyes:  Sclera clear, no icterus.   Conjunctiva pink. Ears:  Normal auditory acuity. Neck:  Supple; no masses or thyromegaly. Lungs:  Respirations even and unlabored.  Clear throughout to auscultation.   No wheezes, crackles, or rhonchi. No acute distress. Heart:  Regular rate and rhythm; no murmurs, clicks, rubs, or gallops. Abdomen:  Normal bowel sounds.  No bruits.  Soft, tender to 1 finger palpation while flexing the abdominal wall muscles and non-distended without masses, hepatosplenomegaly or hernias noted.  No guarding or rebound tenderness.  Positive Carnett sign.   Rectal:  Deferred.  Pulses:  Normal pulses noted. Extremities:  No clubbing or edema.  No cyanosis. Neurologic:  Alert and oriented x3;  grossly normal neurologically. Skin:  Intact without significant lesions or rashes.  No jaundice. Lymph Nodes:  No significant cervical adenopathy. Psych:  Alert and cooperative. Normal mood and affect.  Imaging Studies: No results found.  Assessment and Plan:   Connie Romero is a 20 y.o. y/o female with a history of back pain who comes in with right lower quadrant pain and alternating diarrhea and constipation. The patient also states that she has to run to the bathroom 30 minutes after eating but her Abdominal pain is not associated with bowel movement eating drinking or any GI symptoms.  On physical exam The abdominal pain is clearly musculoskeletal with increased pain while obtaining the patient's legs 6 inches above the exam table and palpated abdominal wall muscles with 1 finger.  She has been explained that P with back pain are more common to have front musculoskeletal pain.  Her abdominal discomfort is also  associated with movement.  As for her alternating diarrhea and constipation the patient  has been told that she likely has irritable bowel syndrome and she will be started on dicyclomine 20 mg 3 times a day and has been told to start Citrucel for her diarrhea and constipation.  The patient has been explained the plan and agrees with it.    36, MD. Midge Minium    Note: This dictation was prepared with Dragon dictation along with smaller phrase technology. Any transcriptional errors that result from this process are unintentional.

## 2021-05-13 NOTE — Patient Instructions (Signed)
Dr. Servando Snare has recommended you to take Citrucel daily.

## 2021-05-21 ENCOUNTER — Encounter: Payer: Self-pay | Admitting: Emergency Medicine

## 2021-05-21 DIAGNOSIS — N939 Abnormal uterine and vaginal bleeding, unspecified: Secondary | ICD-10-CM | POA: Insufficient documentation

## 2021-05-21 DIAGNOSIS — Z5321 Procedure and treatment not carried out due to patient leaving prior to being seen by health care provider: Secondary | ICD-10-CM | POA: Insufficient documentation

## 2021-05-21 LAB — COMPREHENSIVE METABOLIC PANEL
ALT: 26 U/L (ref 0–44)
AST: 19 U/L (ref 15–41)
Albumin: 4.2 g/dL (ref 3.5–5.0)
Alkaline Phosphatase: 59 U/L (ref 38–126)
Anion gap: 5 (ref 5–15)
BUN: 9 mg/dL (ref 6–20)
CO2: 27 mmol/L (ref 22–32)
Calcium: 9.1 mg/dL (ref 8.9–10.3)
Chloride: 106 mmol/L (ref 98–111)
Creatinine, Ser: 0.69 mg/dL (ref 0.44–1.00)
GFR, Estimated: >60 mL/min (ref >60)
Glucose, Bld: 90 mg/dL (ref 70–99)
Potassium: 4 mmol/L (ref 3.5–5.1)
Sodium: 138 mmol/L (ref 135–145)
Total Bilirubin: 0.6 mg/dL (ref 0.3–1.2)
Total Protein: 8.2 g/dL — ABNORMAL HIGH (ref 6.5–8.1)

## 2021-05-21 LAB — URINALYSIS, COMPLETE (UACMP) WITH MICROSCOPIC
Bacteria, UA: NONE SEEN
RBC / HPF: >50 RBC/hpf — ABNORMAL HIGH (ref 0–5)
Specific Gravity, Urine: 1.028 (ref 1.005–1.030)

## 2021-05-21 LAB — CBC WITH DIFFERENTIAL/PLATELET
Abs Immature Granulocytes: 0.04 10*3/uL (ref 0.00–0.07)
Basophils Absolute: 0.1 10*3/uL (ref 0.0–0.1)
Basophils Relative: 1 %
Eosinophils Absolute: 0.2 10*3/uL (ref 0.0–0.5)
Eosinophils Relative: 1 %
HCT: 41.8 % (ref 36.0–46.0)
Hemoglobin: 13.8 g/dL (ref 12.0–15.0)
Immature Granulocytes: 0 %
Lymphocytes Relative: 25 %
Lymphs Abs: 3.1 10*3/uL (ref 0.7–4.0)
MCH: 27.7 pg (ref 26.0–34.0)
MCHC: 33 g/dL (ref 30.0–36.0)
MCV: 83.9 fL (ref 80.0–100.0)
Monocytes Absolute: 0.8 10*3/uL (ref 0.1–1.0)
Monocytes Relative: 6 %
Neutro Abs: 8.2 10*3/uL — ABNORMAL HIGH (ref 1.7–7.7)
Neutrophils Relative %: 67 %
Platelets: 333 10*3/uL (ref 150–400)
RBC: 4.98 MIL/uL (ref 3.87–5.11)
RDW: 14.1 % (ref 11.5–15.5)
WBC: 12.5 10*3/uL — ABNORMAL HIGH (ref 4.0–10.5)
nRBC: 0 % (ref 0.0–0.2)

## 2021-05-21 LAB — POC URINE PREG, ED: Preg Test, Ur: NEGATIVE

## 2021-05-21 NOTE — ED Triage Notes (Addendum)
Pt c/o vaginal bleeding x2 weeks, with need of 3-4 menstrual regular size pads per day. Pt sts she is on birth control and had her regular period before the bleeding and has not missed a dose of BC. Hx/o hypothyroid disease with report of no medication x1 week. Hx/o PCOS.

## 2021-05-22 ENCOUNTER — Emergency Department: Payer: Medicaid Other

## 2021-05-22 ENCOUNTER — Emergency Department
Admission: EM | Admit: 2021-05-22 | Discharge: 2021-05-22 | Disposition: A | Discharge status: Home / Self Care | Payer: Medicaid Other | Attending: Student in an Organized Health Care Education/Training Program | Admitting: Student in an Organized Health Care Education/Training Program

## 2021-05-22 DIAGNOSIS — N939 Abnormal uterine and vaginal bleeding, unspecified: Secondary | ICD-10-CM

## 2021-05-22 NOTE — ED Notes (Signed)
No answer when called several times from lobby 

## 2021-05-22 NOTE — ED Notes (Signed)
No answer when called several times from lobby; no answer when phone # listed in chart called 

## 2021-08-24 ENCOUNTER — Emergency Department: Payer: Medicaid Other

## 2021-08-24 ENCOUNTER — Encounter: Payer: Self-pay | Admitting: Emergency Medicine

## 2021-08-24 ENCOUNTER — Other Ambulatory Visit: Payer: Self-pay

## 2021-08-24 ENCOUNTER — Emergency Department
Admission: EM | Admit: 2021-08-24 | Discharge: 2021-08-24 | Disposition: A | Discharge status: Home / Self Care | Payer: Medicaid Other | Attending: Emergency Medicine | Admitting: Emergency Medicine

## 2021-08-24 DIAGNOSIS — S0990XA Unspecified injury of head, initial encounter: Secondary | ICD-10-CM

## 2021-08-24 DIAGNOSIS — W228XXA Striking against or struck by other objects, initial encounter: Secondary | ICD-10-CM | POA: Diagnosis not present

## 2021-08-24 DIAGNOSIS — S060X0A Concussion without loss of consciousness, initial encounter: Secondary | ICD-10-CM | POA: Diagnosis not present

## 2021-08-24 NOTE — ED Provider Triage Note (Signed)
°  Emergency Medicine Provider Triage Evaluation Note  Harris Kistler , a 21 y.o.female,  was evaluated in triage.  Pt complains of head injury.  Patient states that she was accidentally pushed into the corner of a wall where the left side of her head hit the corner and also caused her to fall backwards and hit her head on the ground.  She states that she went unconscious.  Reports ongoing nausea and headache.  Additionally endorses neck pain and states that her nose was bleeding earlier this morning.  Denies fever/chills, abdominal pain, back pain, or shortness of breath   Review of Systems  Positive: Headache, nausea Negative: Denies fever, chest pain, vomiting  Physical Exam  There were no vitals filed for this visit. Gen:   Awake, no distress   Resp:  Normal effort  MSK:   Moves extremities without difficulty  Other:  Tenderness appreciated on the left side of her head/face.  Midline cervical spine tenderness as well.  Medical Decision Making  Given the patient's initial medical screening exam, the following diagnostic evaluation has been ordered. The patient will be placed in the appropriate treatment space, once one is available, to complete the evaluation and treatment. I have discussed the plan of care with the patient and I have advised the patient that an ED physician or mid-level practitioner will reevaluate their condition after the test results have been received, as the results may give them additional insight into the type of treatment they may need.    Diagnostics: Head CT.  Neck CT.  Treatments: none immediately   Varney Daily, [place] 08/24/21 1346

## 2021-08-24 NOTE — ED Triage Notes (Signed)
Pt states she was hit her head into the wall. Pt states she loss consciousness for a couple of second. Pt c/o headache. Pt had a concussion in October. Pt is A&OX4 and NAD

## 2021-08-24 NOTE — Discharge Instructions (Signed)
Follow-up with either Dr. Katrinka Blazing at the concussion clinic or [facility] clinic neurology.  Please call for an appointment. Return the emergency department if worsening

## 2021-08-24 NOTE — ED Provider Notes (Signed)
[facility] Provider Note    Event Date/Time   First MD Initiated Contact with Patient 08/24/21 1431     (approximate)   History   Head Injury   HPI  Connie Romero is a 21 y.o. female presents emergency department after hitting her head and losing consciousness for few seconds last night.  Patient had a concussion 3 months ago.  States this feels the same.  She is very sensitive to light and noise, felt a little dizzy this morning.  No numbness or tingling.  No slurred speech.  No change in vision.      Physical Exam   Triage Vital Signs: ED Triage Vitals [08/24/21 1348]  Enc Vitals Group     BP 121/76     Pulse Rate 68     Resp 18     Temp 99 F (37.2 C)     Temp Source Oral     SpO2 97 %     Weight 265 lb (120.2 kg)     Height 5\' 5"  (1.651 m)     Head Circumference      Peak Flow      Pain Score 9     Pain Loc      Pain Edu?      Excl. in GC?     Most recent vital signs: Vitals:   08/24/21 1348  BP: 121/76  Pulse: 68  Resp: 18  Temp: 99 F (37.2 C)  SpO2: 97%     General: Awake, no distress.   CV:  Good peripheral perfusion. regular rate and  rhythm Resp:  Normal effort. Abd:  No distention.   Other:  Cranial nerves II through XII grossly intact, no gross neurologic deficits are noted   ED Results / Procedures / Treatments   Labs (all labs ordered are listed, but only abnormal results are displayed) Labs Reviewed - No data to display   EKG     RADIOLOGY CT the head and C-spine    PROCEDURES:   Procedures   MEDICATIONS ORDERED IN ED: Medications - No data to display   IMPRESSION / MDM / ASSESSMENT AND PLAN / ED COURSE  I reviewed the triage vital signs and the nursing notes.                              Differential diagnosis includes, but is not limited to, concussion, SAH, subdural, skull fracture  CT of the head and C-spine  CT of the head and C-spine were reviewed by me.  Awaiting radiology  read  Radiologist reads the CT of the head and C-spine is negative  I did explain these findings to the patient.  I do feel that she might have a mild concussion due to the photosensitivity and noise sensitivity.  Patient is to remain in a quiet dark room.  Take Tylenol/ibuprofen for pain as needed.  Drink plenty of fluids.  She was given a work note with couple of days off of work as she works in 08/26/21.  She is to follow-up with [facility] concussion clinic with Dr. Personnel officer, or follow-up with [facility] clinic neurology as this is the second concussion in less than 6 months.  Patient is in agreement treatment plan.  Tylenol/ibuprofen for pain as needed.  She is discharged stable condition.         FINAL CLINICAL IMPRESSION(S) / ED DIAGNOSES   Final diagnoses:  Minor  head injury, initial encounter  Concussion without loss of consciousness, initial encounter     Rx / DC Orders   ED Discharge Orders     None        Note:  This document was prepared using Dragon voice recognition software and may include unintentional dictation errors.    Faythe Ghee, PA-C 08/24/21 1458    Gilles Chiquito, MD 08/24/21 [phone]

## 2021-08-24 NOTE — ED Notes (Signed)
Patient declined discharge vital signs. 

## 2021-10-21 ENCOUNTER — Emergency Department
Admission: EM | Admit: 2021-10-21 | Discharge: 2021-10-21 | Disposition: A | Discharge status: Home / Self Care | Payer: Medicaid Other | Attending: Emergency Medicine | Admitting: Emergency Medicine

## 2021-10-21 ENCOUNTER — Other Ambulatory Visit: Payer: Self-pay

## 2021-10-21 ENCOUNTER — Encounter: Payer: Self-pay | Admitting: Emergency Medicine

## 2021-10-21 DIAGNOSIS — D72829 Elevated white blood cell count, unspecified: Secondary | ICD-10-CM | POA: Insufficient documentation

## 2021-10-21 DIAGNOSIS — R109 Unspecified abdominal pain: Secondary | ICD-10-CM

## 2021-10-21 DIAGNOSIS — K582 Mixed irritable bowel syndrome: Secondary | ICD-10-CM

## 2021-10-21 DIAGNOSIS — R197 Diarrhea, unspecified: Secondary | ICD-10-CM | POA: Diagnosis not present

## 2021-10-21 DIAGNOSIS — K581 Irritable bowel syndrome with constipation: Secondary | ICD-10-CM | POA: Diagnosis not present

## 2021-10-21 DIAGNOSIS — R1031 Right lower quadrant pain: Secondary | ICD-10-CM | POA: Diagnosis present

## 2021-10-21 LAB — COMPREHENSIVE METABOLIC PANEL
ALT: 34 U/L (ref 0–44)
AST: 25 U/L (ref 15–41)
Albumin: 4.1 g/dL (ref 3.5–5.0)
Alkaline Phosphatase: 57 U/L (ref 38–126)
Anion gap: 8 (ref 5–15)
BUN: 9 mg/dL (ref 6–20)
CO2: 25 mmol/L (ref 22–32)
Calcium: 9 mg/dL (ref 8.9–10.3)
Chloride: 107 mmol/L (ref 98–111)
Creatinine, Ser: 0.66 mg/dL (ref 0.44–1.00)
GFR, Estimated: >60 mL/min (ref >60)
Glucose, Bld: 120 mg/dL — ABNORMAL HIGH (ref 70–99)
Potassium: 3.8 mmol/L (ref 3.5–5.1)
Sodium: 140 mmol/L (ref 135–145)
Total Bilirubin: 0.6 mg/dL (ref 0.3–1.2)
Total Protein: 8.1 g/dL (ref 6.5–8.1)

## 2021-10-21 LAB — CBC
HCT: 40.3 % (ref 36.0–46.0)
Hemoglobin: 12.7 g/dL (ref 12.0–15.0)
MCH: 26.8 pg (ref 26.0–34.0)
MCHC: 31.5 g/dL (ref 30.0–36.0)
MCV: 85.2 fL (ref 80.0–100.0)
Platelets: 347 10*3/uL (ref 150–400)
RBC: 4.73 MIL/uL (ref 3.87–5.11)
RDW: 13.6 % (ref 11.5–15.5)
WBC: 10.3 10*3/uL (ref 4.0–10.5)
nRBC: 0 % (ref 0.0–0.2)

## 2021-10-21 LAB — LIPASE, BLOOD: Lipase: 26 U/L (ref 11–51)

## 2021-10-21 MED ORDER — DICYCLOMINE HCL 20 MG PO TABS: 20.0000 mg | ORAL_TABLET | Freq: Four times a day (QID) | ORAL | 0 refills | Status: DC

## 2021-10-21 NOTE — ED Provider Notes (Signed)
? ?[facility] ?Provider Note ? ? ? Event Date/Time  ? First MD Initiated Contact with Patient 10/21/21 1953   ?  (approximate) ? ? ?History  ? ?Abdominal Pain ? ? ?HPI ? ?Shaquoya Cosper is a 21 y.o. female who presents to the ED for evaluation of Abdominal Pain ?  ?Morbidly obese patient with history of PCOS and is s/p cholecystectomy. ?I review outpatient GI visit from October where patient was evaluated for right-sided abdominal pain, diagnosed with IBS and discharged with Bentyl. ? ?Patient presents to the ED for evaluation of acute on chronic right-sided abdominal pain.  She reports often feeling more chronic pain to her RLQ, but today is more to the mid right-sided abdomen.  She reports cramping pain with nausea and poor appetite without emesis.  Reports feeling constipated for the past couple days, sometimes has diarrhea.  Denies urinary changes, substernal chest pain or difficulty breathing.  Does not take the Bentyl prescribed and does not know what that is, says that she has never heard the term IBS before. ? ?Physical Exam  ? ?Triage Vital Signs: ?ED Triage Vitals  ?Enc Vitals Group  ?   BP 10/21/21 1838 120/87  ?   Pulse Rate 10/21/21 1838 99  ?   Resp 10/21/21 1838 20  ?   Temp 10/21/21 1838 98.7 ?F (37.1 ?C)  ?   Temp Source 10/21/21 1838 Oral  ?   SpO2 10/21/21 1838 99 %  ?   Weight 10/21/21 1836 270 lb (122.5 kg)  ?   Height 10/21/21 1836 5\' 5"  (1.651 m)  ?   Head Circumference --   ?   Peak Flow --   ?   Pain Score 10/21/21 1836 9  ?   Pain Loc --   ?   Pain Edu? --   ?   Excl. in GC? --   ? ? ?Most recent vital signs: ?Vitals:  ? 10/21/21 1838 10/21/21 2035  ?BP: 120/87 119/71  ?Pulse: 99 75  ?Resp: 20 18  ?Temp: 98.7 ?F (37.1 ?C) 98.1 ?F (36.7 ?C)  ?SpO2: 99% 96%  ? ? ?General: Awake, no distress.  Ambulatory with normal gait. ?CV:  Good peripheral perfusion.  ?Resp:  Normal effort.  ?Abd:  No distention.  Soft and benign throughout ?MSK:  No deformity noted.  ?Neuro:  No focal  deficits appreciated. ?Other:   ? ? ?ED Results / Procedures / Treatments  ? ?Labs ?(all labs ordered are listed, but only abnormal results are displayed) ?Labs Reviewed  ?COMPREHENSIVE METABOLIC PANEL - Abnormal; Notable for the following components:  ?    Result Value  ? Glucose, Bld 120 (*)   ? All other components within normal limits  ?LIPASE, BLOOD  ?CBC  ?URINALYSIS, ROUTINE W REFLEX MICROSCOPIC  ?POC URINE PREG, ED  ? ? ?EKG ?Sinus rhythm, rate of 72 bpm.  Rightward axis and normal intervals.  Nonspecific ST changes to lateral and inferior leads. ? ?RADIOLOGY ? ? ?Official radiology report(s): ?No results found. ? ?PROCEDURES and INTERVENTIONS: ? ?Procedures ? ?Medications - No data to display ? ? ?IMPRESSION / MDM / ASSESSMENT AND PLAN / ED COURSE  ?I reviewed the triage vital signs and the nursing notes. ? ?21 year old female presents to the ED with acute on chronic abdominal pain, likely IBS and suitable for outpatient management.  Normal blood work and reassuring exam without evidence of abdominal tenderness, peritoneal features or guarding.  Looks well overall.  No signs of  skin changes or trauma overlying the area of pain.  Work-up is benign with normal CBC, CMP and lipase.  EKG is nonischemic.  Suspect IBS.  We discussed the importance of adherence to her Bentyl and following up with GI.  We discussed return precautions for the ED and she is suitable for outpatient management.  Less likely appendicitis considering her lack of RLQ tenderness and leukocytosis. ? ?  ? ? ?FINAL CLINICAL IMPRESSION(S) / ED DIAGNOSES  ? ?Final diagnoses:  ?Irritable bowel syndrome with both constipation and diarrhea  ?Abdominal pain, unspecified abdominal location  ? ? ? ?Rx / DC Orders  ? ?ED Discharge Orders   ? ?      Ordered  ?  dicyclomine (BENTYL) 20 MG tablet  Every 6 hours       ? 10/21/21 2028  ? ?  ?  ? ?  ? ? ? ?Note:  This document was prepared using Dragon voice recognition software and may include  unintentional dictation errors. ?  ?Delton Prairie, MD ?10/21/21 2046 ? ?

## 2021-10-21 NOTE — ED Triage Notes (Signed)
Pt via POV from home. Pt c/o RUQ pain, nausea, and fever. Pt had has gallbladder removed 3 years ago. Denies any urinary symptoms. Symptoms started 3 days ago. Pt is A&OX4 and NAD ?

## 2021-10-21 NOTE — ED Notes (Signed)
Pt d/c home per MD order. Discharge summary reviewed with pt, pt verbalizes understanding. Ambulatory off unit. No s/s of acute distress noted at discharge.  °

## 2021-10-21 NOTE — Discharge Instructions (Signed)
Please are taking the dicyclomine/Bentyl medication for your abdominal pain.  You can take this up to 3-4 times per day. ? ?Please take Tylenol and ibuprofen/Advil for your pain.  It is safe to take them together, or to alternate them every few hours.  Take up to 1000mg  of Tylenol at a time, up to 4 times per day.  Do not take more than 4000 mg of Tylenol in 24 hours.  For ibuprofen, take 400-600 mg, 4-5 times per day. ? ?If you develop any further worsening symptoms despite this, please return to the ED for evaluation. ?

## 2022-02-11 ENCOUNTER — Other Ambulatory Visit: Payer: Self-pay

## 2022-02-11 ENCOUNTER — Emergency Department: Payer: Medicaid Other

## 2022-02-11 ENCOUNTER — Encounter: Payer: Self-pay | Admitting: *Deleted

## 2022-02-11 DIAGNOSIS — R0789 Other chest pain: Secondary | ICD-10-CM | POA: Diagnosis not present

## 2022-02-11 DIAGNOSIS — J45909 Unspecified asthma, uncomplicated: Secondary | ICD-10-CM | POA: Diagnosis not present

## 2022-02-11 DIAGNOSIS — E039 Hypothyroidism, unspecified: Secondary | ICD-10-CM | POA: Diagnosis not present

## 2022-02-11 DIAGNOSIS — R42 Dizziness and giddiness: Secondary | ICD-10-CM | POA: Insufficient documentation

## 2022-02-11 DIAGNOSIS — Z79899 Other long term (current) drug therapy: Secondary | ICD-10-CM | POA: Diagnosis not present

## 2022-02-11 DIAGNOSIS — E119 Type 2 diabetes mellitus without complications: Secondary | ICD-10-CM | POA: Diagnosis not present

## 2022-02-11 LAB — BASIC METABOLIC PANEL
Anion gap: 7 (ref 5–15)
BUN: 9 mg/dL (ref 6–20)
CO2: 28 mmol/L (ref 22–32)
Calcium: 9.1 mg/dL (ref 8.9–10.3)
Chloride: 104 mmol/L (ref 98–111)
Creatinine, Ser: 0.79 mg/dL (ref 0.44–1.00)
GFR, Estimated: >60 mL/min (ref >60)
Glucose, Bld: 125 mg/dL — ABNORMAL HIGH (ref 70–99)
Potassium: 3.8 mmol/L (ref 3.5–5.1)
Sodium: 139 mmol/L (ref 135–145)

## 2022-02-11 LAB — CBC
HCT: 41.9 % (ref 36.0–46.0)
Hemoglobin: 13.4 g/dL (ref 12.0–15.0)
MCH: 27.4 pg (ref 26.0–34.0)
MCHC: 32 g/dL (ref 30.0–36.0)
MCV: 85.7 fL (ref 80.0–100.0)
Platelets: 338 10*3/uL (ref 150–400)
RBC: 4.89 MIL/uL (ref 3.87–5.11)
RDW: 13.5 % (ref 11.5–15.5)
WBC: 12.4 10*3/uL — ABNORMAL HIGH (ref 4.0–10.5)
nRBC: 0 % (ref 0.0–0.2)

## 2022-02-11 LAB — TROPONIN I (HIGH SENSITIVITY): Troponin I (High Sensitivity): 2 ng/L (ref <18)

## 2022-02-11 NOTE — ED Triage Notes (Signed)
Pt reports while at work tonight she became lightheaded.  Pt states she has not eaten since this am.   Pt has chest pain also.  No sob.  Pt alert.

## 2022-02-12 ENCOUNTER — Emergency Department
Admission: EM | Admit: 2022-02-12 | Discharge: 2022-02-12 | Disposition: A | Discharge status: Home / Self Care | Payer: Medicaid Other | Attending: Emergency Medicine | Admitting: Emergency Medicine

## 2022-02-12 DIAGNOSIS — R0789 Other chest pain: Secondary | ICD-10-CM

## 2022-02-12 DIAGNOSIS — R42 Dizziness and giddiness: Secondary | ICD-10-CM

## 2022-02-12 LAB — HCG, QUANTITATIVE, PREGNANCY: hCG, Beta Chain, Quant, S: <1 m[IU]/mL (ref <5)

## 2022-02-12 NOTE — Discharge Instructions (Addendum)
You may alternate Tylenol 1000 mg every 6 hours as needed for pain, fever and Ibuprofen 800 mg every 6-8 hours as needed for pain, fever.  Please take Ibuprofen with food.  Do not take more than 4000 mg of Tylenol (acetaminophen) in a 24 hour period.  I recommend increasing your fluid intake over the next several days.  I recommend increased rest.   Your lab work, chest x-ray, EKG were reassuring today.

## 2022-02-12 NOTE — ED Notes (Signed)
Pt given sandwich and water. RN asked why she hasn't eaten today. She stays she was too busy at work. Instructed her to try to eat.

## 2022-02-12 NOTE — ED Provider Notes (Signed)
[facility] Provider Note    Event Date/Time   First MD Initiated Contact with Patient 02/12/22 0201     (approximate)   History   Dizziness   HPI  Connie Romero is a 21 y.o. female with history of obesity, anxiety, asthma, PCOS, hypothyroidism, diabetes who presents to the emergency department with sharp central chest pain and lightheadedness.  States symptoms started while at work today.  No fevers, cough, vomiting, diarrhea, bloody stools, melena.  Has had some shortness of breath.  Chest pain worse with palpation of the chest.  No history of PE, DVT, exogenous estrogen use, recent fractures, surgery, trauma, hospitalization, prolonged travel or other immobilization. No lower extremity swelling or pain. No calf tenderness.  States she has been at work all day and has not had anything to eat in several hours.   History provided by patient.    Past Medical History:  Diagnosis Date   Abdominal pain    Anxiety    Asthma    Cough    Diabetes mellitus without complication (HCC)    Fatty liver    Hypothyroid    Obesity    PCOS (polycystic ovarian syndrome)    Sleep apnea    Thyroid disease     Past Surgical History:  Procedure Laterality Date   CHOLECYSTECTOMY N/A 10/06/2018   Procedure: LAPAROSCOPIC CHOLECYSTECTOMY;  Surgeon: Carolan Shiver, MD;  Location: [facility] ORS;  Service: General;  Laterality: N/A;   hypothyroidisn     pcos     UVULOPALATOPHARYNGOPLASTY     WISDOM TOOTH EXTRACTION      MEDICATIONS:  Prior to Admission medications   Medication Sig Start Date End Date Taking? Authorizing Provider  dicyclomine (BENTYL) 20 MG tablet Take 1 tablet (20 mg total) by mouth 3 (three) times daily before meals. 05/13/21   Midge Minium, MD  dicyclomine (BENTYL) 20 MG tablet Take 1 tablet (20 mg total) by mouth every 6 (six) hours. 10/21/21   Delton Prairie, MD  Drospirenone (SLYND) 4 MG TABS Take 1 tablet by mouth at bedtime. 09/28/20   Linzie Collin, MD  Levothyroxine Sodium 50 MCG CAPS Take 50 mcg by mouth daily before breakfast. 04/13/18   [provider]    Physical Exam   Triage Vital Signs: ED Triage Vitals  Enc Vitals Group     BP 02/11/22 2238 122/76     Pulse Rate 02/11/22 2238 73     Resp 02/11/22 2238 18     Temp 02/11/22 2238 98.5 F (36.9 C)     Temp Source 02/11/22 2238 Oral     SpO2 02/11/22 2238 97 %     Weight 02/11/22 2220 260 lb (117.9 kg)     Height 02/11/22 2220 5\' 5"  (1.651 m)     Head Circumference --      Peak Flow --      Pain Score 02/11/22 2220 9     Pain Loc --      Pain Edu? --      Excl. in GC? --     Most recent vital signs: Vitals:   02/12/22 0200 02/12/22 0230  BP: 131/79 120/68  Pulse: 77 78  Resp: (!) 24 (!) 22  Temp:    SpO2: 97% 97%    CONSTITUTIONAL: Alert and oriented and responds appropriately to questions. Well-appearing; well-nourished HEAD: Normocephalic, atraumatic EYES: Conjunctivae clear, pupils appear equal, sclera nonicteric ENT: normal nose; moist mucous membranes NECK: Supple, normal ROM CARD: RRR;  S1 and S2 appreciated; no murmurs, no clicks, no rubs, no gallops CHEST:  Chest wall is tender to palpation which reproduces her pain.  No crepitus, ecchymosis, erythema, warmth, rash or other lesions present.   RESP: Normal chest excursion without splinting or tachypnea; breath sounds clear and equal bilaterally; no wheezes, no rhonchi, no rales, no hypoxia or respiratory distress, speaking full sentences ABD/GI: Normal bowel sounds; non-distended; soft, non-tender, no rebound, no guarding, no peritoneal signs BACK: The back appears normal EXT: Normal ROM in all joints; no deformity noted, no edema; no cyanosis, no calf tenderness or calf swelling SKIN: Normal color for age and race; warm; no rash on exposed skin NEURO: Moves all extremities equally, normal speech PSYCH: The patient's mood and manner are appropriate.   ED Results / Procedures /  Treatments   LABS: (all labs ordered are listed, but only abnormal results are displayed) Labs Reviewed  BASIC METABOLIC PANEL - Abnormal; Notable for the following components:      Result Value   Glucose, Bld 125 (*)    All other components within normal limits  CBC - Abnormal; Notable for the following components:   WBC 12.4 (*)    All other components within normal limits  HCG, QUANTITATIVE, PREGNANCY  TROPONIN I (HIGH SENSITIVITY)     EKG:  EKG Interpretation  Date/Time:  Tuesday February 11 2022 22:41:40 EDT Ventricular Rate:  70 PR Interval:  156 QRS Duration: 98 QT Interval:  404 QTC Calculation: 436 R Axis:   73 Text Interpretation: Normal sinus rhythm with sinus arrhythmia Nonspecific T wave abnormality Abnormal ECG When compared with ECG of 21-Oct-2021 18:40, No significant change was found Confirmed by Rochele Raring [phone]) on 02/12/2022 2:01:28 AM         RADIOLOGY: My personal review and interpretation of imaging: Chest x-ray clear.  I have personally reviewed all radiology reports.   DG Chest 2 View  Result Date: 02/11/2022 CLINICAL DATA:  Lightheadedness, chest pain EXAM: CHEST - 2 VIEW COMPARISON:  03/30/2021 FINDINGS: The heart size and mediastinal contours are within normal limits. Both lungs are clear. The visualized skeletal structures are unremarkable. IMPRESSION: No active cardiopulmonary disease. Electronically Signed   By: Sharlet Salina M.D.   On: 02/11/2022 23:04     PROCEDURES:  Critical Care performed: No      .1-3 Lead EKG Interpretation  Performed by: Ismahan Lippman, Layla Maw, DO Authorized by: Ciclaly Mulcahey, Layla Maw, DO     Interpretation: normal     ECG rate:  78   ECG rate assessment: normal     Rhythm: sinus rhythm     Ectopy: none     Conduction: normal       IMPRESSION / MDM / ASSESSMENT AND PLAN / ED COURSE  I reviewed the triage vital signs and the nursing notes.    Patient here with atypical chest pain, lightheadedness.  The  patient is on the cardiac monitor to evaluate for evidence of arrhythmia and/or significant heart rate changes.   DIFFERENTIAL DIAGNOSIS (includes but not limited to):   Dehydration, hypoglycemia, anemia, electrolyte derangement, less likely ACS, PE, dissection   Patient's presentation is most consistent with acute presentation with potential threat to life or bodily function.   PLAN: We will obtain CBC, BMP, troponin, hCG, chest x-ray.  EKG nonischemic without arrhythmia, interval abnormality.  Will encourage oral fluids.  Will check orthostatic vital signs.  Chest pain is reproducible with palpation of her chest wall and likely musculoskeletal.  She  declines any pain medication.   MEDICATIONS GIVEN IN ED: Medications - No data to display   ED COURSE: Patient's labs show slight leukocytosis of 12,000 which she has had multiple times previously.  Normal hemoglobin.  Normal electrolytes.  Troponin negative.  Pregnancy test negative.  Chest x-ray reviewed/interpreted by myself radiologist and shows no acute abnormality.  Orthostats negative.  Have recommended Tylenol, Motrin over-the-counter as needed for pain.  Recommended increase fluid intake, avoiding alcohol and caffeine and rest.  Have offered her a work note which she declines.  She has been able to ambulate here.  I feel she is safe for discharge for further outpatient work-up if symptoms not improving with symptomatic management.   At this time, I do not feel there is any life-threatening condition present. I reviewed all nursing notes, vitals, pertinent previous records.  All lab and urine results, EKGs, imaging ordered have been independently reviewed and interpreted by myself.  I reviewed all available radiology reports from any imaging ordered this visit.  Based on my assessment, I feel the patient is safe to be discharged home without further emergent workup and can continue workup as an outpatient as needed. Discussed all findings,  treatment plan as well as usual and customary return precautions.  They verbalize understanding and are comfortable with this plan.  Outpatient follow-up has been provided as needed.  All questions have been answered.    CONSULTS: No admission required at this time.  Patient with reassuring work-up with atypical symptoms and no significant risk factors for ACS and PERC negative.   OUTSIDE RECORDS REVIEWED: Reviewed patient's last OB/GYN note with Lucio Edward on 05/01/2021       FINAL CLINICAL IMPRESSION(S) / ED DIAGNOSES   Final diagnoses:  Lightheadedness  Atypical chest pain     Rx / DC Orders   ED Discharge Orders     None        Note:  This document was prepared using Dragon voice recognition software and may include unintentional dictation errors.   Leighana Neyman, Layla Maw, DO 02/12/22 0501

## 2022-04-29 ENCOUNTER — Encounter: Payer: Self-pay | Admitting: Medical Oncology

## 2022-04-29 ENCOUNTER — Emergency Department: Payer: Self-pay

## 2022-04-29 ENCOUNTER — Other Ambulatory Visit: Payer: Self-pay

## 2022-04-29 ENCOUNTER — Emergency Department
Admission: EM | Admit: 2022-04-29 | Discharge: 2022-04-29 | Disposition: A | Discharge status: Home / Self Care | Payer: Self-pay | Attending: Emergency Medicine | Admitting: Emergency Medicine

## 2022-04-29 DIAGNOSIS — J020 Streptococcal pharyngitis: Secondary | ICD-10-CM | POA: Insufficient documentation

## 2022-04-29 DIAGNOSIS — R0789 Other chest pain: Secondary | ICD-10-CM | POA: Insufficient documentation

## 2022-04-29 DIAGNOSIS — E039 Hypothyroidism, unspecified: Secondary | ICD-10-CM | POA: Insufficient documentation

## 2022-04-29 LAB — GROUP A STREP BY PCR: Group A Strep by PCR: DETECTED — AB

## 2022-04-29 MED ORDER — DEXAMETHASONE 1 MG/ML PO CONC
10.0000 mg | Freq: Once | ORAL | Status: DC
  Filled 2022-04-29: qty 10

## 2022-04-29 MED ORDER — AMOXICILLIN 500 MG PO TABS: 500.0000 mg | ORAL_TABLET | Freq: Two times a day (BID) | ORAL | 0 refills | Status: AC

## 2022-04-29 MED ORDER — DEXAMETHASONE 10 MG/ML FOR PEDIATRIC ORAL USE
10.0000 mg | Freq: Once | INTRAMUSCULAR | Status: AC
Start: 2022-04-29 — End: 2022-04-29
  Administered 2022-04-29: 10 mg via ORAL
  Filled 2022-04-29: qty 1

## 2022-04-29 NOTE — ED Provider Notes (Signed)
[facility] Provider Note    Event Date/Time   First MD Initiated Contact with Patient 04/29/22 [phone]     (approximate)   History   Chief Complaint Generalized Body Aches, Cough, and Nasal Congestion   HPI Connie Romero is a 21 y.o. female, history of morbid obesity, polycystic ovarian syndrome, obstructive sleep apnea, hypothyroidism, anxiety, presents emergency department for evaluation of flulike symptoms.  Reports myalgias, sore throat, mild cough, and intermittent chest discomfort on the right side x6 days.  She was evaluated at urgent care yesterday, where they tested her for flu/COVID, though these were reportedly negative.  Denies shortness of breath, abdominal pain, flank pain, nausea/vomiting, diarrhea, dysuria, headache, rash/lesions, or dizziness/lightheadedness.  History Limitations: No limitations.        Physical Exam  Triage Vital Signs: ED Triage Vitals [04/29/22 0704]  Enc Vitals Group     BP 135/87     Pulse Rate 71     Resp 16     Temp 98 F (36.7 C)     Temp Source Oral     SpO2 95 %     Weight 250 lb (113.4 kg)     Height 5\' 5"  (1.651 m)     Head Circumference      Peak Flow      Pain Score 10     Pain Loc      Pain Edu?      Excl. in [place]?     Most recent vital signs: Vitals:   04/29/22 0704  BP: 135/87  Pulse: 71  Resp: 16  Temp: 98 F (36.7 C)  SpO2: 95%    General: Awake, NAD.  Skin: Warm, dry. No rashes or lesions.  Eyes: PERRL. Conjunctivae normal.  CV: Good peripheral perfusion.  Resp: Normal effort.  Lung sounds are clear bilaterally. Abd: Soft, non-tender. No distention.  Neuro: At baseline. No gross neurological deficits.  Musculoskeletal: Normal ROM of all extremities.  Focused Exam: Tonsils are notably swollen and erythematous with exudates.  Uvula midline.  No trismus.  No drooling.  Physical Exam    ED Results / Procedures / Treatments  Labs (all labs ordered are listed, but only abnormal  results are displayed) Labs Reviewed  GROUP A STREP BY PCR - Abnormal; Notable for the following components:      Result Value   Group A Strep by PCR DETECTED (*)    All other components within normal limits     EKG Sinus rhythm, rate of 61, no QT prolongation, no ST segment changes, normal QRS interval, no AV blocks, no axis deviations.    RADIOLOGY  ED Provider Interpretation: I personally reviewed and interpreted this x-ray, no evidence of acute cardiopulmonary abnormalities.  Hypoinflation noted.  DG Chest 2 View  Result Date: 04/29/2022 CLINICAL DATA:  Chest pain with myalgias, shortness of breath, cough and congestion as well as headache 4 days. EXAM: CHEST - 2 VIEW COMPARISON:  02/11/2022 FINDINGS: Lungs are hypoinflated without focal airspace consolidation or effusion. Cardiomediastinal silhouette and remainder of the exam is unchanged. IMPRESSION: Hypoinflation without acute cardiopulmonary disease. Electronically Signed   By: Elberta Fortis M.D.   On: 04/29/2022 07:50    PROCEDURES:  Critical Care performed: N/A.  Procedures    MEDICATIONS ORDERED IN ED: Medications  dexamethasone (DECADRON) 1 MG/ML solution 10 mg (has no administration in time range)     IMPRESSION / MDM / ASSESSMENT AND PLAN / ED COURSE  I reviewed the triage  vital signs and the nursing notes.                              Differential diagnosis includes, but is not limited to, COVID-19, influenza, strep pharyngitis, tonsillitis, community-acquired pneumonia, bronchitis, viral URI, allergic rhinitis.  ED Course Patient appears well, vitals within normal limits.  NAD.  Afebrile.  Strep PCR positive.  Assessment/Plan Presentation consistent with strep pharyngitis.  No signs of peritonsillar or retropharyngeal abscesses.  Overall appears well clinically.  Chest x-ray shows no signs of pneumonia.  We will provide her with a single dose of dexamethasone p.o. here in the emergency department.   Additionally provided her with a prescription for amoxicillin to take at home.  Encouraged her to take Tylenol/ibuprofen as needed for pain or discomfort.  She was amenable to this plan.  Will discharge.  Provided the patient with anticipatory guidance, return precautions, and educational material. Encouraged the patient to return to the emergency department at any time if they begin to experience any new or worsening symptoms. Patient expressed understanding and agreed with the plan.   Patient's presentation is most consistent with acute complicated illness / injury requiring diagnostic workup.       FINAL CLINICAL IMPRESSION(S) / ED DIAGNOSES   Final diagnoses:  Strep pharyngitis     Rx / DC Orders   ED Discharge Orders          Ordered    amoxicillin (AMOXIL) 500 MG tablet  2 times daily        04/29/22 0908             Note:  This document was prepared using Dragon voice recognition software and may include unintentional dictation errors.   Teodoro Spray, [place] 04/29/22 2025    Harvest Dark, MD 04/29/22 [phone]

## 2022-04-29 NOTE — Discharge Instructions (Addendum)
-  Please take the full course of the antibiotics as prescribed.  -You may additionally take Tylenol or ibuprofen as needed for pain/discomfort.  -Return to the emergency department anytime if you begin to experience any new or worsening symptoms.

## 2022-04-29 NOTE — ED Notes (Signed)
89 yof with a c/c of body aches, chills, and headache since Wednesday with worsening symptoms since Saturday. The pt was warm, pink, and dry. The pt is alert and oriented x 4. The pt was able to ambulate to the room without assistance or distress.

## 2022-04-29 NOTE — ED Triage Notes (Signed)
Pt ambulatory to triage with reports of flu like sx's since Wednesday. Pt reports seen at [facility] yesterday and covid and flu was neg. Denies fever.

## 2022-05-29 ENCOUNTER — Emergency Department
Admission: EM | Admit: 2022-05-29 | Discharge: 2022-05-29 | Disposition: A | Discharge status: Home / Self Care | Payer: Self-pay | Attending: Emergency Medicine | Admitting: Emergency Medicine

## 2022-05-29 ENCOUNTER — Encounter: Payer: Self-pay | Admitting: *Deleted

## 2022-05-29 ENCOUNTER — Other Ambulatory Visit: Payer: Self-pay

## 2022-05-29 DIAGNOSIS — J020 Streptococcal pharyngitis: Secondary | ICD-10-CM | POA: Insufficient documentation

## 2022-05-29 DIAGNOSIS — E039 Hypothyroidism, unspecified: Secondary | ICD-10-CM | POA: Insufficient documentation

## 2022-05-29 DIAGNOSIS — J45909 Unspecified asthma, uncomplicated: Secondary | ICD-10-CM | POA: Insufficient documentation

## 2022-05-29 DIAGNOSIS — E119 Type 2 diabetes mellitus without complications: Secondary | ICD-10-CM | POA: Insufficient documentation

## 2022-05-29 LAB — GROUP A STREP BY PCR: Group A Strep by PCR: DETECTED — AB

## 2022-05-29 MED ORDER — AMOXICILLIN 875 MG PO TABS: 875.0000 mg | ORAL_TABLET | Freq: Two times a day (BID) | ORAL | 0 refills | Status: DC

## 2022-05-29 MED ORDER — AMOXICILLIN 500 MG PO CAPS
500.0000 mg | ORAL_CAPSULE | Freq: Once | ORAL | Status: AC
  Administered 2022-05-29: 500 mg via ORAL
  Filled 2022-05-29: qty 1

## 2022-05-29 NOTE — ED Provider Notes (Signed)
[facility] Provider Note    Event Date/Time   First MD Initiated Contact with Patient 05/29/22 1955     (approximate)   History   Sore Throat   HPI  Connie Romero is a 21 y.o. female with history of asthma, hypothyroid, PCOS, diabetes presents emergency department with blisters on the soles of her feet palms of her hands along with a sore throat.  No fever or chills.  Symptoms x2 days      Physical Exam   Triage Vital Signs: ED Triage Vitals  Enc Vitals Group     BP 05/29/22 1737 126/66     Pulse Rate 05/29/22 1737 60     Resp 05/29/22 1737 17     Temp 05/29/22 1737 98.6 F (37 C)     Temp Source 05/29/22 1737 Oral     SpO2 05/29/22 1737 97 %     Weight 05/29/22 1746 250 lb (113.4 kg)     Height 05/29/22 1746 5\' 5"  (1.651 m)     Head Circumference --      Peak Flow --      Pain Score 05/29/22 1746 8     Pain Loc --      Pain Edu? --      Excl. in [place]? --     Most recent vital signs: Vitals:   05/29/22 1737  BP: 126/66  Pulse: 60  Resp: 17  Temp: 98.6 F (37 C)  SpO2: 97%     General: Awake, no distress.   CV:  Good peripheral perfusion. regular rate and  rhythm Resp:  Normal effort. Lungs CTA Abd:  No distention.   Other:     ED Results / Procedures / Treatments   Labs (all labs ordered are listed, but only abnormal results are displayed) Labs Reviewed  GROUP A STREP BY PCR - Abnormal; Notable for the following components:      Result Value   Group A Strep by PCR DETECTED (*)    All other components within normal limits  RPR     EKG     RADIOLOGY     PROCEDURES:   Procedures   MEDICATIONS ORDERED IN ED: Medications  amoxicillin (AMOXIL) capsule 500 mg (has no administration in time range)     IMPRESSION / MDM / ASSESSMENT AND PLAN / ED COURSE  I reviewed the triage vital signs and the nursing notes.                              Differential diagnosis includes, but is not limited to,  hand-foot-and-mouth, viral pharyngitis, syphilis  Patient's presentation is most consistent with acute complicated illness / injury requiring diagnostic workup.   Strep test, RPR   Strep test is positive.  Do still feel that the patient will need RPR due to the rash on the palms of the hands and soles of the feet.  I did explain to her that if this test is positive she will need to have a injection of penicillin and amoxicillin in which she can either return to the emergency department or go to [place].  Patient is in agreement treatment plan.  She is to discard her toothbrush in 3 to 4 days.  Gargle with warm salt water and finish the antibiotic.  She was discharged in stable condition.   FINAL CLINICAL IMPRESSION(S) / ED DIAGNOSES   Final diagnoses:  Acute  streptococcal pharyngitis     Rx / DC Orders   ED Discharge Orders          Ordered    amoxicillin (AMOXIL) 875 MG tablet  2 times daily        05/29/22 2120             Note:  This document was prepared using Dragon voice recognition software and may include unintentional dictation errors.    Faythe Ghee, PA-C 05/29/22 2123    Minna Antis, MD 05/29/22 2253

## 2022-05-29 NOTE — ED Notes (Signed)
Signing pad did not work. Pt verbalized understanding of DC instructions. 

## 2022-05-29 NOTE — ED Triage Notes (Signed)
Pt has sore throat for 2 days.  Pt also has blisters on hands and feet for 1 day   pt alert

## 2022-05-29 NOTE — ED Provider Triage Note (Signed)
Emergency Medicine Provider Triage Evaluation Note  Connie Romero, a 21 y.o. female  was evaluated in triage.  Pt complains of painful blisters to the hands and feet. She noted onset of sore throat two days earlier. She denies fevers or sick contacts.   Review of Systems  Positive: Sore throat, hand/foot blisters Negative: FCS  Physical Exam  BP 126/66 (BP Location: Left Arm)   Pulse 60   Temp 98.6 F (37 C) (Oral)   Resp 17   LMP 04/24/2022 (Approximate)   SpO2 97%  Gen:   Awake, no distress  NAD Resp:  Normal effort CTA MSK:   Moves extremities without difficulty  SKIN:  Erythematous, macular circular lesions to the palms, soles not visualized  Medical Decision Making  Medically screening exam initiated at 5:46 PM.  Appropriate orders placed.  Dolly Harbach was informed that the remainder of the evaluation will be completed by another provider, this initial triage assessment does not replace that evaluation, and the importance of remaining in the ED until their evaluation is complete.  Patient to the ED for evaluation of painful blisters noted to the palms and soles as well as onset of sore throat 2 days prior.  She denies any fevers, chills, or sweats.   Melvenia Needles, PA-C 05/29/22 1751

## 2022-05-29 NOTE — Discharge Instructions (Signed)
Follow-up with your regular doctor if not improving 3 days.  Use antibiotic as prescribed.  You must finish this medication or the strep throat will return.  Discard your toothbrush in 3 to 4 days.  We do not want you to reinfect yourself.  Gargle with warm salt water. If the syphilis test returns is positive you will need to be treated with penicillin shots.  You can return to the emergency department for your first shot and then follow-up with the health department or you may do all of this through the health department.  I have low suspicion that this will be positive but I do feel it is needed as you have a rash on your hands and your feet

## 2022-05-30 LAB — RPR: RPR Ser Ql: NONREACTIVE

## 2022-07-15 ENCOUNTER — Emergency Department
Admission: EM | Admit: 2022-07-15 | Discharge: 2022-07-15 | Disposition: A | Discharge status: Home / Self Care | Payer: Self-pay | Attending: Emergency Medicine | Admitting: Emergency Medicine

## 2022-07-15 ENCOUNTER — Other Ambulatory Visit: Payer: Self-pay

## 2022-07-15 DIAGNOSIS — Z20822 Contact with and (suspected) exposure to covid-19: Secondary | ICD-10-CM | POA: Insufficient documentation

## 2022-07-15 DIAGNOSIS — J101 Influenza due to other identified influenza virus with other respiratory manifestations: Secondary | ICD-10-CM

## 2022-07-15 LAB — RESP PANEL BY RT-PCR (RSV, FLU A&B, COVID)  RVPGX2
Influenza A by PCR: POSITIVE — AB
Influenza B by PCR: NEGATIVE
Resp Syncytial Virus by PCR: NEGATIVE
SARS Coronavirus 2 by RT PCR: NEGATIVE

## 2022-07-15 MED ORDER — BENZONATATE 100 MG PO CAPS: 100.0000 mg | ORAL_CAPSULE | Freq: Three times a day (TID) | ORAL | 0 refills | Status: DC | PRN

## 2022-07-15 MED ORDER — ONDANSETRON 4 MG PO TBDP: 4.0000 mg | ORAL_TABLET | Freq: Three times a day (TID) | ORAL | 0 refills | Status: DC | PRN

## 2022-07-15 MED ORDER — FLUTICASONE PROPIONATE 50 MCG/ACT NA SUSP: 1.0000 | Freq: Two times a day (BID) | NASAL | 0 refills | Status: DC

## 2022-07-15 MED ORDER — PSEUDOEPH-BROMPHEN-DM 30-2-10 MG/5ML PO SYRP: 10.0000 mL | ORAL_SOLUTION | Freq: Four times a day (QID) | ORAL | 0 refills | Status: DC | PRN

## 2022-07-15 NOTE — ED Provider Notes (Signed)
[facility] Provider Note  Patient Contact: 5:15 PM (approximate)   History   Fever and Cough   HPI  Connie Romero is a 21 y.o. female who presents the emergency department complaining of congestion, sore throat, cough, body aches, headache.  Symptoms began yesterday.  Multiple sick contacts are reported.  No chest pain, shortness of breath, abdominal pain or GI complaints.     Physical Exam   Triage Vital Signs: ED Triage Vitals  Enc Vitals Group     BP 07/15/22 1514 126/87     Pulse Rate 07/15/22 1514 87     Resp 07/15/22 1514 17     Temp 07/15/22 1514 99.2 F (37.3 C)     Temp Source 07/15/22 1514 Oral     SpO2 07/15/22 1514 97 %     Weight 07/15/22 1515 250 lb (113.4 kg)     Height 07/15/22 1515 5\' 5"  (1.651 m)     Head Circumference --      Peak Flow --      Pain Score 07/15/22 1515 8     Pain Loc --      Pain Edu? --      Excl. in [place]? --     Most recent vital signs: Vitals:   07/15/22 1514  BP: 126/87  Pulse: 87  Resp: 17  Temp: 99.2 F (37.3 C)  SpO2: 97%     General: Alert and in no acute distress. ENT:      Ears:       Nose: No congestion/rhinnorhea.      Mouth/Throat: Mucous membranes are moist.  No gross oropharyngeal erythema or edema Neck: No stridor. No cervical spine tenderness to palpation.  Cardiovascular:  Good peripheral perfusion Respiratory: Normal respiratory effort without tachypnea or retractions. Lungs CTAB. Good air entry to the bases with no decreased or absent breath sounds. Musculoskeletal: Full range of motion to all extremities.  Neurologic:  No gross focal neurologic deficits are appreciated.  Skin:   No rash noted Other:   ED Results / Procedures / Treatments   Labs (all labs ordered are listed, but only abnormal results are displayed) Labs Reviewed  RESP PANEL BY RT-PCR (RSV, FLU A&B, COVID)  [id number] - Abnormal; Notable for the following components:      Result Value   Influenza A by PCR  POSITIVE (*)    All other components within normal limits     EKG     RADIOLOGY    No results found.  PROCEDURES:  Critical Care performed: No  Procedures   MEDICATIONS ORDERED IN ED: Medications - No data to display   IMPRESSION / MDM / ASSESSMENT AND PLAN / ED COURSE  I reviewed the triage vital signs and the nursing notes.                              Differential diagnosis includes, but is not limited to, influenza, COVID, RSV, strep, otitis, pneumonia  Patient's presentation is most consistent with acute presentation with potential threat to life or bodily function.   Patient's diagnosis is consistent with influenza A.  Patient presents emergency department flulike symptoms.  Tested positive for influenza A.  Patient will have symptom control medications written at this time for her.  Follow-up primary care as needed.  Tylenol Motrin and plenty fluids and rest at home.. Patient is given ED precautions to return to the ED for any  worsening or new symptoms.        FINAL CLINICAL IMPRESSION(S) / ED DIAGNOSES   Final diagnoses:  Influenza A     Rx / DC Orders   ED Discharge Orders          Ordered    benzonatate (TESSALON PERLES) 100 MG capsule  3 times daily PRN        07/15/22 1718    brompheniramine-pseudoephedrine-DM 30-2-10 MG/5ML syrup  4 times daily PRN        07/15/22 1718    fluticasone (FLONASE) 50 MCG/ACT nasal spray  2 times daily        07/15/22 1718    ondansetron (ZOFRAN-ODT) 4 MG disintegrating tablet  Every 8 hours PRN        07/15/22 1718             Note:  This document was prepared using Dragon voice recognition software and may include unintentional dictation errors.   Racheal Patches, PA-C 07/15/22 1719    Shaune Pollack, MD 07/15/22 2314

## 2022-07-15 NOTE — ED Notes (Signed)
Pt states coming in for headache, body aches and a cough. Pt states headache 8/10. Pt ambulatory to room.

## 2022-07-15 NOTE — ED Provider Triage Note (Signed)
Emergency Medicine Provider Triage Evaluation Note  Connie Romero , a 21 y.o. female  was evaluated in triage.  Pt complains of cough, headache, body aches, fever since yesterday. Took ibuprofen today. Reports everyone at work is sick. No flu shot this year  Review of Systems  Positive: Cough, headache, body aches Negative: Abd pain, sore throat, n/v/d  Physical Exam  There were no vitals taken for this visit. Gen:   Awake, no distress   Resp:  Normal effort  MSK:   Moves extremities without difficulty  Other:    Medical Decision Making  Medically screening exam initiated at 3:10 PM.  Appropriate orders placed.  Connie Romero was informed that the remainder of the evaluation will be completed by another provider, this initial triage assessment does not replace that evaluation, and the importance of remaining in the ED until their evaluation is complete.     Jackelyn Hoehn, PA-C 07/15/22 1513

## 2022-07-15 NOTE — ED Triage Notes (Signed)
Pt comes in today via pov with complaints of headache, cough, and fever since yesterday. Pt has been taking ibuprofen for the pain. Pt states she has been around people that are sick. Pt is alert and oriented and in no obvious signs distress.

## 2022-07-29 ENCOUNTER — Emergency Department
Admission: EM | Admit: 2022-07-29 | Discharge: 2022-07-29 | Discharge status: Against Medical Advice | Payer: Medicaid - Dental | Source: Home / Self Care

## 2022-08-13 IMAGING — CR DG CHEST 2V
2 series · 2 of 2 positions shown · non-contrast
Comparison: 09/27/2018

CLINICAL DATA: Chest pain

EXAM:
CHEST - 2 VIEW

[chest pa]
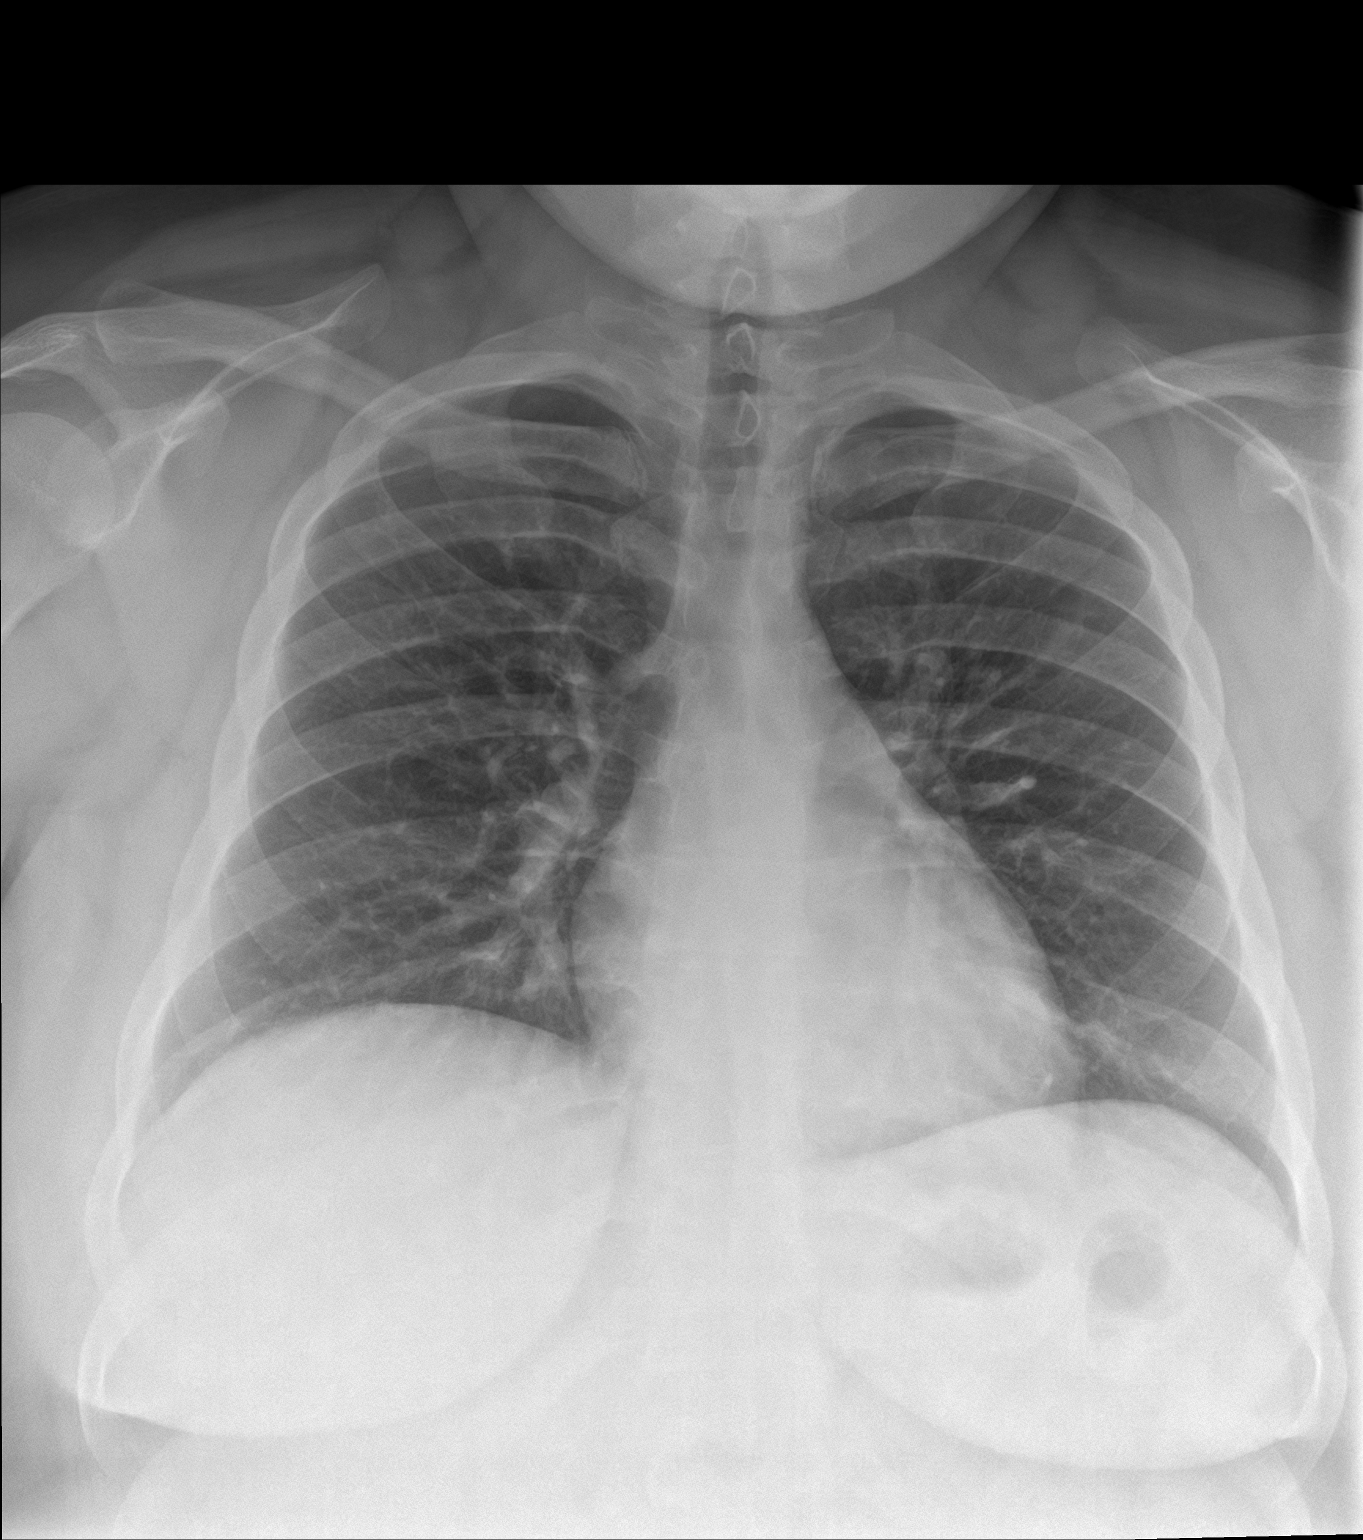

[chest lat]
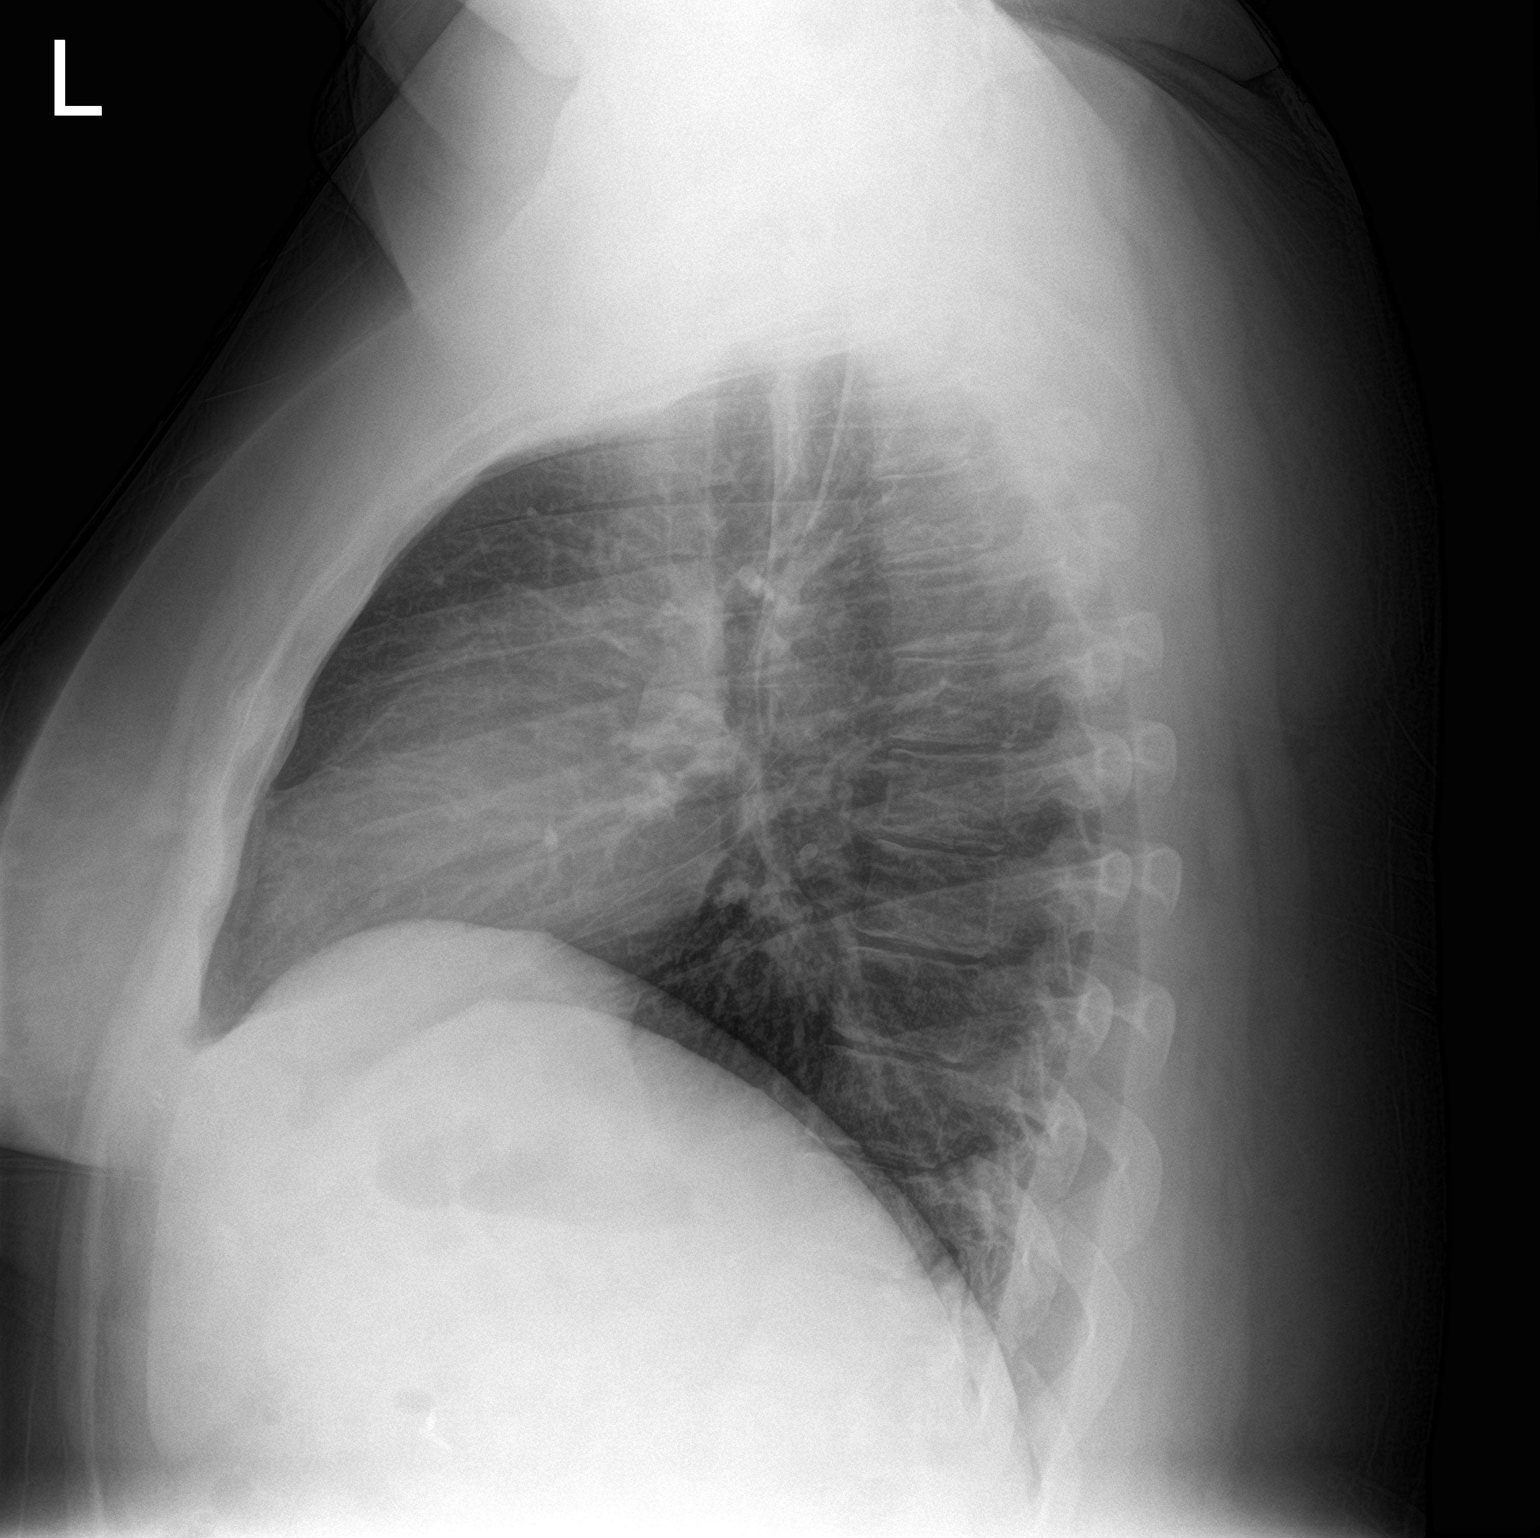

[2 of 2 positions shown; findings below may reference images not displayed]

FINDINGS: The heart size and mediastinal contours are within normal limits.
Both lungs are clear. The visualized skeletal structures are
unremarkable.
IMPRESSION: No active cardiopulmonary disease.

## 2022-10-17 DIAGNOSIS — Z113 Encounter for screening for infections with a predominantly sexual mode of transmission: Secondary | ICD-10-CM | POA: Diagnosis not present

## 2022-10-17 DIAGNOSIS — J4530 Mild persistent asthma, uncomplicated: Secondary | ICD-10-CM | POA: Diagnosis not present

## 2022-10-17 DIAGNOSIS — E039 Hypothyroidism, unspecified: Secondary | ICD-10-CM | POA: Diagnosis not present

## 2022-10-17 DIAGNOSIS — E282 Polycystic ovarian syndrome: Secondary | ICD-10-CM | POA: Diagnosis not present

## 2022-11-06 DIAGNOSIS — N898 Other specified noninflammatory disorders of vagina: Secondary | ICD-10-CM | POA: Diagnosis not present

## 2022-11-20 ENCOUNTER — Other Ambulatory Visit: Payer: Self-pay

## 2022-11-20 ENCOUNTER — Emergency Department
Admission: EM | Admit: 2022-11-20 | Discharge: 2022-11-21 | Disposition: A | Discharge status: Home / Self Care | Payer: Medicaid Other | Attending: Emergency Medicine | Admitting: Emergency Medicine

## 2022-11-20 ENCOUNTER — Emergency Department: Payer: Medicaid Other

## 2022-11-20 DIAGNOSIS — Z716 Tobacco abuse counseling: Secondary | ICD-10-CM | POA: Diagnosis not present

## 2022-11-20 DIAGNOSIS — J069 Acute upper respiratory infection, unspecified: Secondary | ICD-10-CM | POA: Diagnosis not present

## 2022-11-20 DIAGNOSIS — E039 Hypothyroidism, unspecified: Secondary | ICD-10-CM | POA: Insufficient documentation

## 2022-11-20 DIAGNOSIS — R0600 Dyspnea, unspecified: Secondary | ICD-10-CM | POA: Diagnosis not present

## 2022-11-20 DIAGNOSIS — J4521 Mild intermittent asthma with (acute) exacerbation: Secondary | ICD-10-CM | POA: Insufficient documentation

## 2022-11-20 DIAGNOSIS — R0602 Shortness of breath: Secondary | ICD-10-CM | POA: Diagnosis not present

## 2022-11-20 DIAGNOSIS — Z1152 Encounter for screening for COVID-19: Secondary | ICD-10-CM | POA: Insufficient documentation

## 2022-11-20 LAB — CBC
HCT: 43 % (ref 36.0–46.0)
Hemoglobin: 14.1 g/dL (ref 12.0–15.0)
MCH: 28.8 pg (ref 26.0–34.0)
MCHC: 32.8 g/dL (ref 30.0–36.0)
MCV: 87.8 fL (ref 80.0–100.0)
Platelets: 340 10*3/uL (ref 150–400)
RBC: 4.9 MIL/uL (ref 3.87–5.11)
RDW: 13.6 % (ref 11.5–15.5)
WBC: 14.6 10*3/uL — ABNORMAL HIGH (ref 4.0–10.5)
nRBC: 0 % (ref 0.0–0.2)

## 2022-11-20 LAB — BASIC METABOLIC PANEL
Anion gap: 9 (ref 5–15)
BUN: 9 mg/dL (ref 6–20)
CO2: 25 mmol/L (ref 22–32)
Calcium: 9.1 mg/dL (ref 8.9–10.3)
Chloride: 104 mmol/L (ref 98–111)
Creatinine, Ser: 0.67 mg/dL (ref 0.44–1.00)
GFR, Estimated: >60 mL/min (ref >60)
Glucose, Bld: 113 mg/dL — ABNORMAL HIGH (ref 70–99)
Potassium: 3.5 mmol/L (ref 3.5–5.1)
Sodium: 138 mmol/L (ref 135–145)

## 2022-11-20 LAB — TROPONIN I (HIGH SENSITIVITY): Troponin I (High Sensitivity): <2 ng/L (ref <18)

## 2022-11-20 NOTE — ED Triage Notes (Signed)
Pt to ED via POV c/o SOB. Pt reports SOB started today and has been having nasal congestion/runny nose/cough for 2 days. Hx of asthma. No audible wheezing. Denies CP, N/V, dizziness, fevers

## 2022-11-21 LAB — SARS CORONAVIRUS 2 BY RT PCR: SARS Coronavirus 2 by RT PCR: NEGATIVE

## 2022-11-21 MED ORDER — NICOTINE 14 MG/24HR TD PT24: 14.0000 mg | MEDICATED_PATCH | Freq: Every day | TRANSDERMAL | 3 refills | Status: DC

## 2022-11-21 MED ORDER — PREDNISONE 20 MG PO TABS: 60.0000 mg | ORAL_TABLET | Freq: Once | ORAL | Status: DC

## 2022-11-21 MED ORDER — PREDNISONE 50 MG PO TABS: ORAL_TABLET | ORAL | 0 refills | Status: DC

## 2022-11-21 MED ORDER — DEXAMETHASONE 6 MG PO TABS
10.0000 mg | ORAL_TABLET | Freq: Once | ORAL | Status: DC
  Filled 2022-11-21: qty 1

## 2022-11-21 MED ORDER — IPRATROPIUM-ALBUTEROL 0.5-2.5 (3) MG/3ML IN SOLN
6.0000 mL | Freq: Once | RESPIRATORY_TRACT | Status: AC
  Administered 2022-11-21: 6 mL via RESPIRATORY_TRACT
  Filled 2022-11-21: qty 3

## 2022-11-21 MED ORDER — DEXAMETHASONE 6 MG PO TABS
10.0000 mg | ORAL_TABLET | Freq: Once | ORAL | Status: AC
  Administered 2022-11-21: 10 mg via ORAL
  Filled 2022-11-21: qty 1

## 2022-11-21 MED ORDER — NICOTINE POLACRILEX 4 MG MT LOZG: 4.0000 mg | LOZENGE | OROMUCOSAL | 0 refills | Status: DC | PRN

## 2022-11-21 NOTE — Discharge Instructions (Addendum)
Take your albuterol inhaler 2 puffs every 4-6 hours as needed for shortness of breath and wheezing.   See your primary doctor this week for a follow-up visit.  Check MyChart for the results of your COVID test.  If you have severe difficulty breathing, or any other new worsening or unexpected symptoms call your doctor right away or return to the emergency department

## 2022-11-21 NOTE — ED Provider Notes (Addendum)
[facility] Provider Note    Event Date/Time   First MD Initiated Contact with Patient 11/20/22 2352     (approximate)   History   Shortness of Breath   HPI  Connie Romero is a 22 y.o. female   Past medical history of asthma and PCOS hypothyroid who presents to the emergency department with nasal congestion mild cough and asthma exacerbation for the last couple of days.  No fever or chest pain.  Her albuterol inhaler provides transient relief.  No other acute medical complaints.  External Medical Documents Reviewed: Emergency department visit note dated December 2023 with viral URI symptoms and diagnosed with influenza      Physical Exam   Triage Vital Signs: ED Triage Vitals [11/20/22 2141]  Enc Vitals Group     BP (!) 149/85     Pulse Rate 80     Resp 18     Temp 98.5 F (36.9 C)     Temp Source Oral     SpO2 94 %     Weight 270 lb (122.5 kg)     Height 5\' 5"  (1.651 m)     Head Circumference      Peak Flow      Pain Score 0     Pain Loc      Pain Edu?      Excl. in [place]?     Most recent vital signs: Vitals:   11/20/22 2141  BP: (!) 149/85  Pulse: 80  Resp: 18  Temp: 98.5 F (36.9 C)  SpO2: 94%    General: Awake, no distress.  CV:  Good peripheral perfusion.  Resp:  Normal effort.  Abd:  No distention.  Other:  Nasal Congestion, mild wheezing in both lung fields, otherwise with normal vital signs afebrile nontoxic comfortable appearance no respiratory distress   ED Results / Procedures / Treatments   Labs (all labs ordered are listed, but only abnormal results are displayed) Labs Reviewed  BASIC METABOLIC PANEL - Abnormal; Notable for the following components:      Result Value   Glucose, Bld 113 (*)    All other components within normal limits  CBC - Abnormal; Notable for the following components:   WBC 14.6 (*)    All other components within normal limits  SARS CORONAVIRUS 2 BY RT PCR  TROPONIN I (HIGH SENSITIVITY)      I ordered and reviewed the above labs they are notable for white blood cell count is 14  EKG  ED ECG REPORT I, Pilar Jarvis, the attending physician, personally viewed and interpreted this ECG.   Date: 11/21/2022  EKG Time: 2144  Rate: 74  Rhythm: nsr  Axis: nl  Intervals:none  ST&T Change: no stemi    RADIOLOGY I independently reviewed and interpreted chest x-ray and see no obvious focality or pneumothorax   PROCEDURES:  Critical Care performed: No  Procedures   MEDICATIONS ORDERED IN ED: Medications  ipratropium-albuterol (DUONEB) 0.5-2.5 (3) MG/3ML nebulizer solution 6 mL (6 mLs Nebulization Given 11/21/22 0029)  dexamethasone (DECADRON) tablet 10 mg (10 mg Oral Given 11/21/22 0058)    IMPRESSION / MDM / ASSESSMENT AND PLAN / ED COURSE  I reviewed the triage vital signs and the nursing notes.                                Patient's presentation is most consistent with severe exacerbation of  chronic illness.  Differential diagnosis includes, but is not limited to, asthma exacerbation, viral URI, bacterial pneumonia   The patient is on the cardiac monitor to evaluate for evidence of arrhythmia and/or significant heart rate changes.  MDM: This is a patient with viral URI symptoms nasal congestion cough and mild asthma exacerbation no respiratory distress.  Has had transient relief with her albuterol inhaler.  I will give a nebulizer treatment today and steroids.  Fortunately she looks well otherwise and x-ray shows no signs of bacterial pneumonia.  She would like a COVID test so we will send off and she will check her MyChart for the results   -- I spent 5 minutes counseling this patient on smoking cessation.  We spoke about the patient's current tobacco use, impact of smoking, assessed willingness to quit, methods for cessation including medical management and nicotine replacement therapy (which I prescribed to the patient) and advised follow-up with primary  doctor to continue to address smoking cessation.      FINAL CLINICAL IMPRESSION(S) / ED DIAGNOSES   Final diagnoses:  Mild intermittent asthma with exacerbation  Viral URI with cough  Encounter for smoking cessation counseling     Rx / DC Orders   ED Discharge Orders          Ordered    predniSONE (DELTASONE) 50 MG tablet  Status:  Discontinued        11/21/22 0034    nicotine (NICODERM CQ - DOSED IN MG/24 HOURS) 14 mg/24hr patch  Daily        11/21/22 0105    nicotine polacrilex (NICORETTE) 4 MG lozenge  As needed        11/21/22 0105             Note:  This document was prepared using Dragon voice recognition software and may include unintentional dictation errors.    Pilar Jarvis, MD 11/21/22 1610    Pilar Jarvis, MD 11/21/22 [phone]

## 2023-01-11 ENCOUNTER — Emergency Department: Payer: Medicaid Other

## 2023-01-11 ENCOUNTER — Encounter: Payer: Self-pay | Admitting: Emergency Medicine

## 2023-01-11 ENCOUNTER — Emergency Department
Admission: EM | Admit: 2023-01-11 | Discharge: 2023-01-11 | Disposition: A | Discharge status: Home / Self Care | Payer: Medicaid Other | Attending: Emergency Medicine | Admitting: Emergency Medicine

## 2023-01-11 ENCOUNTER — Other Ambulatory Visit: Payer: Self-pay

## 2023-01-11 DIAGNOSIS — E119 Type 2 diabetes mellitus without complications: Secondary | ICD-10-CM | POA: Diagnosis not present

## 2023-01-11 DIAGNOSIS — E039 Hypothyroidism, unspecified: Secondary | ICD-10-CM | POA: Diagnosis not present

## 2023-01-11 DIAGNOSIS — J209 Acute bronchitis, unspecified: Secondary | ICD-10-CM

## 2023-01-11 DIAGNOSIS — R059 Cough, unspecified: Secondary | ICD-10-CM | POA: Diagnosis present

## 2023-01-11 DIAGNOSIS — R042 Hemoptysis: Secondary | ICD-10-CM | POA: Diagnosis not present

## 2023-01-11 LAB — COMPREHENSIVE METABOLIC PANEL
ALT: 34 U/L (ref 0–44)
AST: 23 U/L (ref 15–41)
Albumin: 4.4 g/dL (ref 3.5–5.0)
Alkaline Phosphatase: 60 U/L (ref 38–126)
Anion gap: 9 (ref 5–15)
BUN: 11 mg/dL (ref 6–20)
CO2: 28 mmol/L (ref 22–32)
Calcium: 9 mg/dL (ref 8.9–10.3)
Chloride: 100 mmol/L (ref 98–111)
Creatinine, Ser: 0.7 mg/dL (ref 0.44–1.00)
GFR, Estimated: >60 mL/min (ref >60)
Glucose, Bld: 112 mg/dL — ABNORMAL HIGH (ref 70–99)
Potassium: 3.7 mmol/L (ref 3.5–5.1)
Sodium: 137 mmol/L (ref 135–145)
Total Bilirubin: 0.7 mg/dL (ref 0.3–1.2)
Total Protein: 8.1 g/dL (ref 6.5–8.1)

## 2023-01-11 LAB — CBC WITH DIFFERENTIAL/PLATELET
Abs Immature Granulocytes: 0.08 10*3/uL — ABNORMAL HIGH (ref 0.00–0.07)
Basophils Absolute: 0.1 10*3/uL (ref 0.0–0.1)
Basophils Relative: 1 %
Eosinophils Absolute: 0.3 10*3/uL (ref 0.0–0.5)
Eosinophils Relative: 2 %
HCT: 44.4 % (ref 36.0–46.0)
Hemoglobin: 14.2 g/dL (ref 12.0–15.0)
Immature Granulocytes: 1 %
Lymphocytes Relative: 31 %
Lymphs Abs: 4.8 10*3/uL — ABNORMAL HIGH (ref 0.7–4.0)
MCH: 28.3 pg (ref 26.0–34.0)
MCHC: 32 g/dL (ref 30.0–36.0)
MCV: 88.4 fL (ref 80.0–100.0)
Monocytes Absolute: 1.1 10*3/uL — ABNORMAL HIGH (ref 0.1–1.0)
Monocytes Relative: 8 %
Neutro Abs: 8.7 10*3/uL — ABNORMAL HIGH (ref 1.7–7.7)
Neutrophils Relative %: 57 %
Platelets: 379 10*3/uL (ref 150–400)
RBC: 5.02 MIL/uL (ref 3.87–5.11)
RDW: 13 % (ref 11.5–15.5)
WBC: 15.1 10*3/uL — ABNORMAL HIGH (ref 4.0–10.5)
nRBC: 0 % (ref 0.0–0.2)

## 2023-01-11 MED ORDER — AMOXICILLIN 875 MG PO TABS: 875.0000 mg | ORAL_TABLET | Freq: Two times a day (BID) | ORAL | 0 refills | Status: DC

## 2023-01-11 NOTE — ED Triage Notes (Addendum)
Pt states that for the last week she has been coughing up blood and had 1 episode of emesis with blood. Pt states no known trauma or sickness for this. Pt states the blood is bright red in color, and a little bit of blood. No cough noted in triage. Pt sitting and chewing gum

## 2023-01-11 NOTE — Discharge Instructions (Signed)
Follow-up with your regular doctor as needed.  Your lab work is normal.  Your chest x-ray was normal.  Return emergency department worsening

## 2023-01-11 NOTE — ED Provider Notes (Signed)
[facility] Provider Note    Event Date/Time   First MD Initiated Contact with Patient 01/11/23 1438     (approximate)   History   Cough (With blood)   HPI  Connie Romero is a 22 y.o. female with history of hypothyroidism, diabetes, fatty liver presents emergency department complaining of coughing up blood.  Patient states it is chunks of blood not streaks.  Also vomited some blood yesterday.  Symptoms have been ongoing for about a week.  States her lower gums are bleeding when she brushes her teeth.  No bruising on her skin.  No chest pain/shortness of breath.      Physical Exam   Triage Vital Signs: ED Triage Vitals  Enc Vitals Group     BP 01/11/23 1431 (!) 173/94     Pulse Rate 01/11/23 1431 92     Resp 01/11/23 1431 20     Temp 01/11/23 1431 97.7 F (36.5 C)     Temp src --      SpO2 01/11/23 1431 96 %     Weight 01/11/23 1432 270 lb (122.5 kg)     Height 01/11/23 1432 5\' 5"  (1.651 m)     Head Circumference --      Peak Flow --      Pain Score 01/11/23 1432 9     Pain Loc --      Pain Edu? --      Excl. in GC? --     Most recent vital signs: Vitals:   01/11/23 1431  BP: (!) 173/94  Pulse: 92  Resp: 20  Temp: 97.7 F (36.5 C)  SpO2: 96%     General: Awake, no distress.   CV:  Good peripheral perfusion. regular rate and  rhythm Resp:  Normal effort. Lungs cta Abd:  No distention.   Other:  No bruising noted   ED Results / Procedures / Treatments   Labs (all labs ordered are listed, but only abnormal results are displayed) Labs Reviewed  COMPREHENSIVE METABOLIC PANEL - Abnormal; Notable for the following components:      Result Value   Glucose, Bld 112 (*)    All other components within normal limits  CBC WITH DIFFERENTIAL/PLATELET - Abnormal; Notable for the following components:   WBC 15.1 (*)    Neutro Abs 8.7 (*)    Lymphs Abs 4.8 (*)    Monocytes Absolute 1.1 (*)    Abs Immature Granulocytes 0.08 (*)    All  other components within normal limits     EKG     RADIOLOGY Chest x-ray    PROCEDURES:   Procedures   MEDICATIONS ORDERED IN ED: Medications - No data to display   IMPRESSION / MDM / ASSESSMENT AND PLAN / ED COURSE  I reviewed the triage vital signs and the nursing notes.                              Differential diagnosis includes, but is not limited to, URI, Barrett's esophagus, esophageal tear, cancer  Patient's presentation is most consistent with acute illness / injury with system symptoms.   Due to the patient's symptoms of coughing up blood we will do basic labs along with chest x-ray   Labs are reassuring, chest x-ray was independently reviewed and interpreted by me as being negative for any acute abnormality  I did explain the findings to the patient.  Feel that she  probably has an esophageal abrasion from coughing.  She can follow-up with her regular doctor if not improving to 3 days.  Return emergency department worsening.  She is in agreement treatment plan.  Discharged stable condition with prescription for amoxicillin and a work note.   FINAL CLINICAL IMPRESSION(S) / ED DIAGNOSES   Final diagnoses:  Acute bronchitis, unspecified organism     Rx / DC Orders   ED Discharge Orders          Ordered    amoxicillin (AMOXIL) 875 MG tablet  2 times daily        01/11/23 1532             Note:  This document was prepared using Dragon voice recognition software and may include unintentional dictation errors.    Faythe Ghee, PA-C 01/11/23 1533    Georga Hacking, MD 01/11/23 Nicholos Johns

## 2023-02-23 IMAGING — CR DG SHOULDER 2+V*L*
3 series · 3 of 3 positions shown · non-contrast
Comparison: None.

CLINICAL DATA: Left shoulder pain

EXAM:
LEFT SHOULDER - 2+ VIEW

[shoulder grashey]
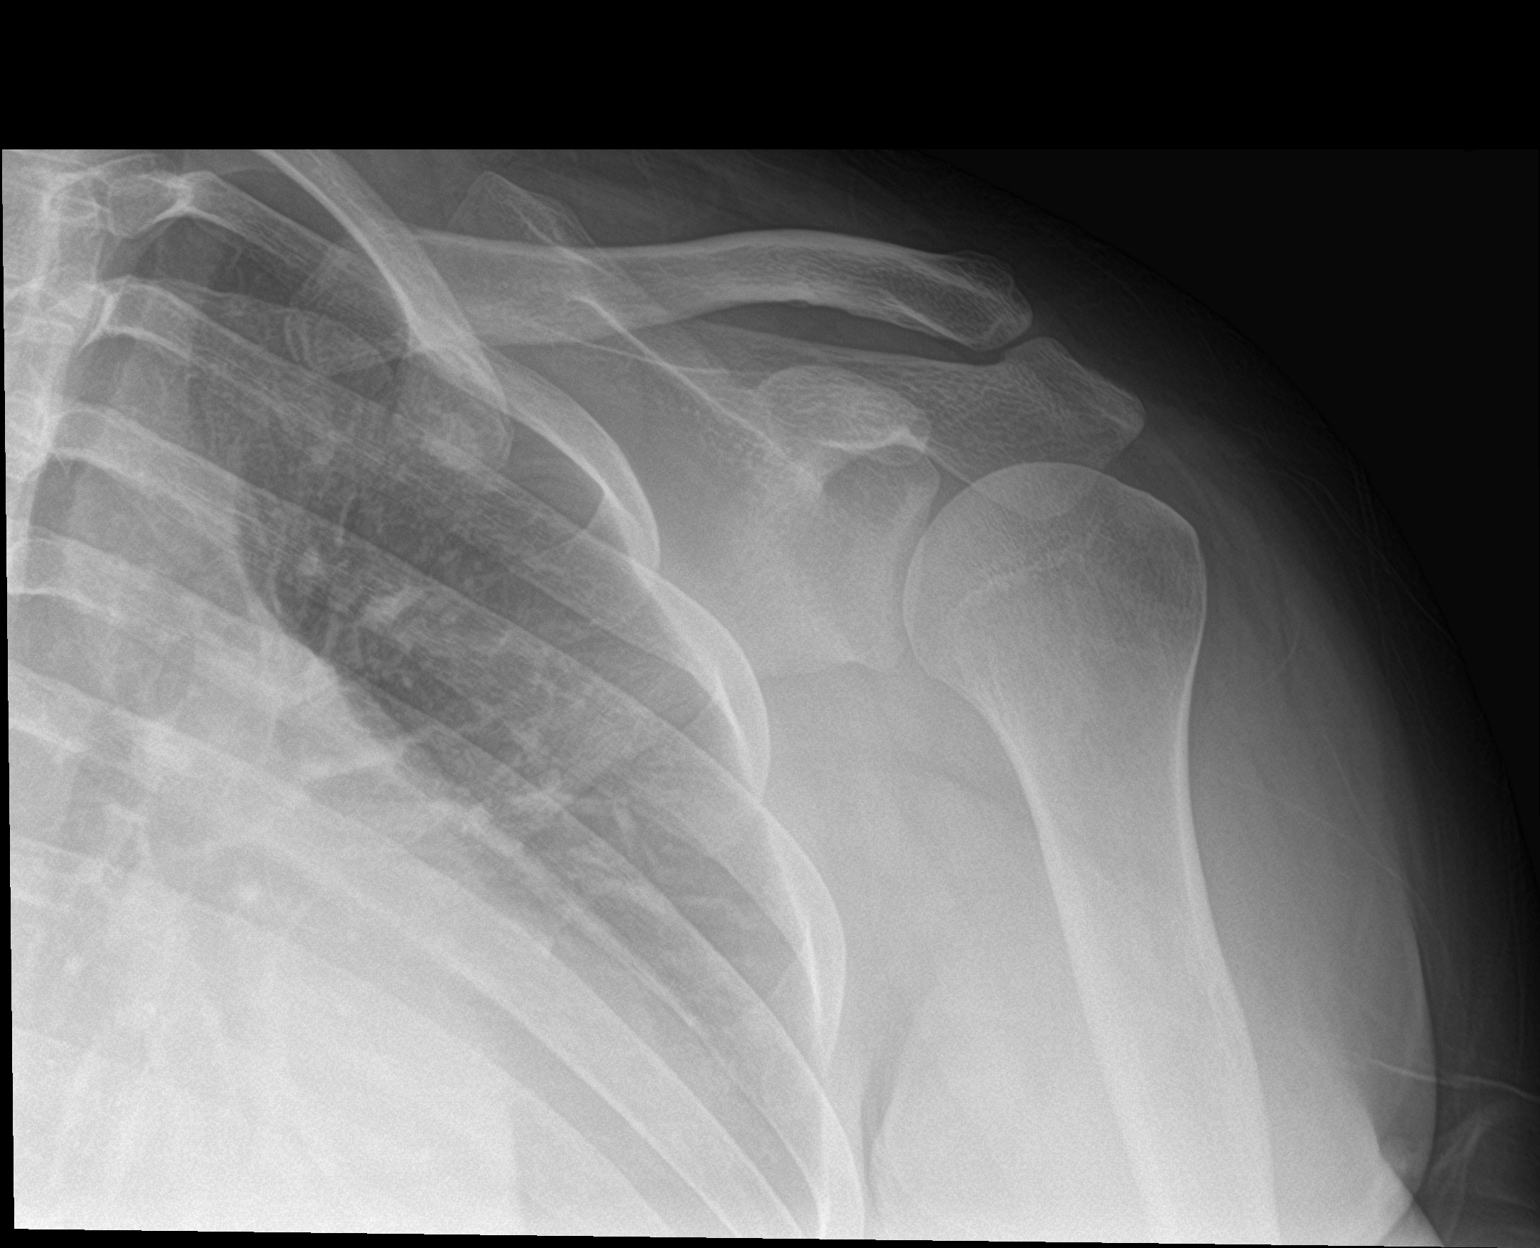

[shoulder axillary]
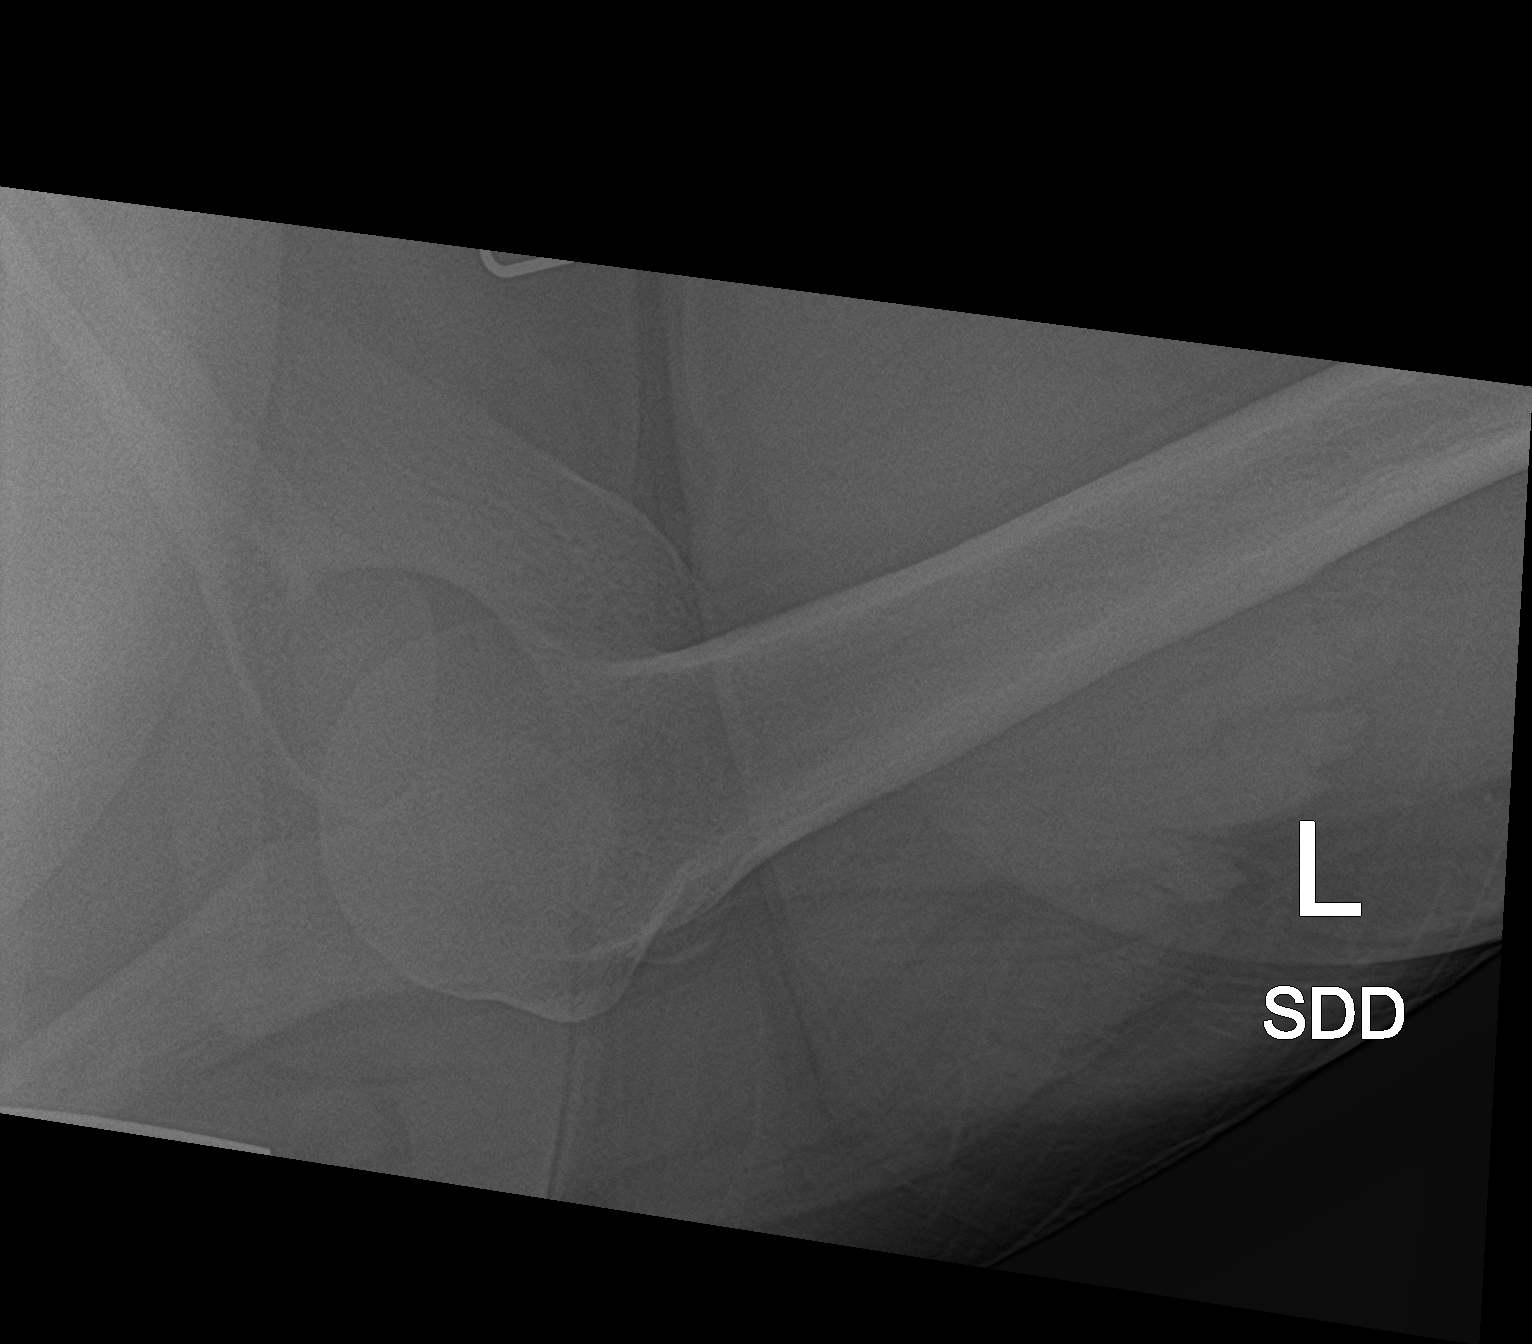

[shoulder y view]
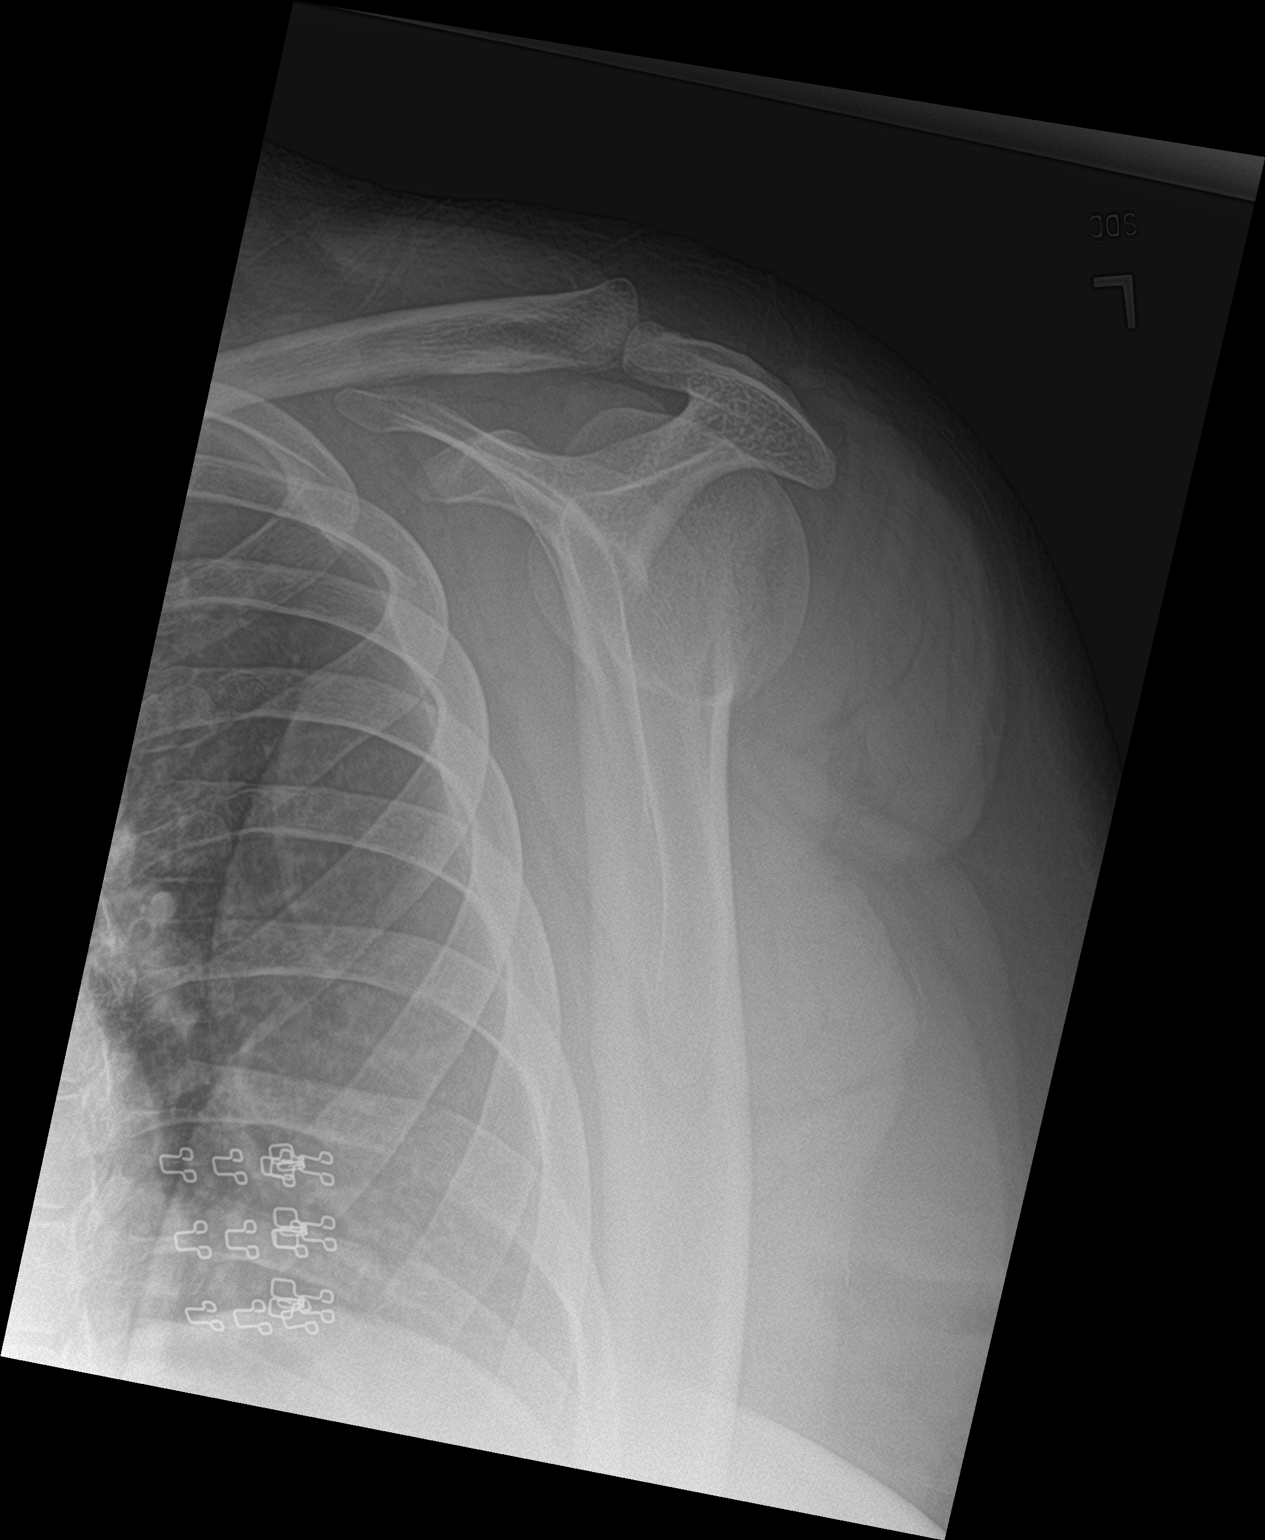

[3 of 3 positions shown; findings below may reference images not displayed]

FINDINGS: There is no evidence of fracture or dislocation. There is no
evidence of arthropathy or other focal bone abnormality. Soft
tissues are unremarkable.
IMPRESSION: Negative.

## 2023-03-05 DIAGNOSIS — J301 Allergic rhinitis due to pollen: Secondary | ICD-10-CM | POA: Diagnosis not present

## 2023-03-05 DIAGNOSIS — J3089 Other allergic rhinitis: Secondary | ICD-10-CM | POA: Diagnosis not present

## 2023-03-05 DIAGNOSIS — R052 Subacute cough: Secondary | ICD-10-CM | POA: Diagnosis not present

## 2023-03-05 DIAGNOSIS — J3081 Allergic rhinitis due to animal (cat) (dog) hair and dander: Secondary | ICD-10-CM | POA: Diagnosis not present

## 2023-03-10 ENCOUNTER — Encounter: Payer: Self-pay | Admitting: Family Medicine

## 2023-04-14 ENCOUNTER — Other Ambulatory Visit: Payer: Self-pay

## 2023-04-20 ENCOUNTER — Encounter: Payer: Self-pay | Admitting: Physician Assistant

## 2023-04-20 ENCOUNTER — Ambulatory Visit (INDEPENDENT_AMBULATORY_CARE_PROVIDER_SITE_OTHER): Payer: Medicaid Other | Admitting: Physician Assistant

## 2023-04-20 VITALS — BP 108/73 | HR 83 | Temp 98.4°F | Ht 65.0 in | Wt 280.6 lb

## 2023-04-20 DIAGNOSIS — K580 Irritable bowel syndrome with diarrhea: Secondary | ICD-10-CM

## 2023-04-20 DIAGNOSIS — R1013 Epigastric pain: Secondary | ICD-10-CM

## 2023-04-20 DIAGNOSIS — K9089 Other intestinal malabsorption: Secondary | ICD-10-CM | POA: Diagnosis not present

## 2023-04-20 MED ORDER — COLESTIPOL HCL 1 G PO TABS: 2.0000 g | ORAL_TABLET | Freq: Every day | ORAL | 1 refills | Status: AC

## 2023-04-20 NOTE — Progress Notes (Signed)
Celso Amy, PA-C [address]  [address]  [place], [place] [address]  Main: [phone]  Fax: [phone]   Gastroenterology Consultation  Referring Provider:     [facility] Primary Care Physician:  [facility], [facility] Primary Gastroenterologist:  Celso Amy, PA-C / Dr. Midge Minium   Reason for Consultation:     Diarrhea, Abdominal Pain        HPI:   Connie Romero is a 22 y.o. y/o female referred for consultation & management  by [facility], [employer]    Patient states her diarrhea started after she had cholecystectomy 09/2018.  She has loose stools after eating.  Rarely has an episode of constipation.  She denies rectal bleeding.  She has intermittent generalized abdominal cramping which comes and goes.  Recently she has had some episodes of epigastric pain.  She denies heartburn or dysphagia.  Not currently on treatment for GERD.  She has history of irritable bowel syndrome.  Takes dicyclomine 20 Mg 3 times daily as needed abdominal pain.    Of note, she started having cough, sneezing, head congestion, and upper respiratory symptoms yesterday.  She is wearing a mask today.  She has not had a COVID test.  Lab 03/04/2023 showed normal iron panel, normal CBC (hemoglobin 13.7), normal CMP except glucose 126.  A1c 5.9.  Elevated TSH 11.  HIV, HCV, and hepatitis B negative.  CT abdomen pelvis without contrast 02/2021: Cholecystectomy, no acute abnormality.  MRI abdomen and pelvis without contrast 11/2020 no abnormality to explain RLQ pain.  No appendicitis.  Mild hepatomegaly.  Previous cholecystectomy 09/2018.  History of fatty liver, diabetes, obesity, PCOS.  Past Medical History:  Diagnosis Date   Abdominal pain    Anxiety    Asthma    Cough    Diabetes mellitus without complication (HCC)    Fatty liver    Hypothyroid    Obesity    PCOS (polycystic ovarian syndrome)    Sleep apnea    Thyroid disease     Past Surgical History:   Procedure Laterality Date   CHOLECYSTECTOMY N/A 10/06/2018   Procedure: LAPAROSCOPIC CHOLECYSTECTOMY;  Surgeon: Carolan Shiver, MD;  Location: [facility] ORS;  Service: General;  Laterality: N/A;   hypothyroidisn     pcos     UVULOPALATOPHARYNGOPLASTY     WISDOM TOOTH EXTRACTION      Prior to Admission medications   Medication Sig Start Date End Date Taking? Authorizing Provider  amoxicillin (AMOXIL) 875 MG tablet Take 1 tablet (875 mg total) by mouth 2 (two) times daily. 01/11/23   Fisher, Roselyn Bering, PA-C  benzonatate (TESSALON PERLES) 100 MG capsule Take 1 capsule (100 mg total) by mouth 3 (three) times daily as needed for cough. 07/15/22 07/15/23  Cuthriell, Delorise Royals, PA-C  brompheniramine-pseudoephedrine-DM 30-2-10 MG/5ML syrup Take 10 mLs by mouth 4 (four) times daily as needed. 07/15/22   Cuthriell, Delorise Royals, PA-C  dicyclomine (BENTYL) 20 MG tablet Take 1 tablet (20 mg total) by mouth every 6 (six) hours. 10/21/21   Delton Prairie, MD  Drospirenone (SLYND) 4 MG TABS Take 1 tablet by mouth at bedtime. 09/28/20   Linzie Collin, MD  fluticasone (FLONASE) 50 MCG/ACT nasal spray Place 1 spray into both nostrils 2 (two) times daily. 07/15/22   Cuthriell, Delorise Royals, PA-C  Levothyroxine Sodium 50 MCG CAPS Take 50 mcg by mouth daily before breakfast. 04/13/18   [provider]  nicotine (NICODERM CQ - DOSED IN MG/24 HOURS)  14 mg/24hr patch Place 1 patch (14 mg total) onto the skin daily. 11/21/22 11/21/23  Pilar Jarvis, MD  nicotine polacrilex (NICORETTE) 4 MG lozenge Take 1 lozenge (4 mg total) by mouth as needed for smoking cessation. 11/21/22   Pilar Jarvis, MD  ondansetron (ZOFRAN-ODT) 4 MG disintegrating tablet Take 1 tablet (4 mg total) by mouth every 8 (eight) hours as needed. 07/15/22   Cuthriell, Delorise Royals, PA-C  VENTOLIN HFA 108 (90 Base) MCG/ACT inhaler Inhale 2 puffs into the lungs every 4 (four) hours as needed. 10/17/22   [provider]    Family History  Problem  Relation Age of Onset   Diabetes Maternal Grandmother    Hypertension Maternal Grandmother    Kidney disease Maternal Grandmother    Lung cancer Maternal Grandmother    Pulmonary embolism Maternal Grandmother      Social History   Tobacco Use   Smoking status: Never   Smokeless tobacco: Never  Vaping Use   Vaping status: Every Day   Start date: 09/26/2018   Substances: Nicotine, Flavoring  Substance Use Topics   Alcohol use: Not Currently    Comment: Rare   Drug use: No    Allergies as of 04/20/2023 - Review Complete 04/20/2023  Allergen Reaction Noted   Shellfish-derived products Nausea And Vomiting 11/04/2016    Review of Systems:    All systems reviewed and negative except where noted in HPI.   Physical Exam:  BP 108/73   Pulse 83   Temp 98.4 F (36.9 C)   Ht 5\' 5"  (1.651 m)   Wt 280 lb 9.6 oz (127.3 kg)   BMI 46.69 kg/m  No LMP recorded.  General:   Alert,  Well-developed, obese, pleasant and cooperative in NAD Lungs:  Respirations even and unlabored.  Clear throughout to auscultation.   No wheezes, crackles, or rhonchi. No acute distress. Heart:  Regular rate and rhythm; no murmurs, clicks, rubs, or gallops. Abdomen:  Normal bowel sounds.  No bruits.  Soft, and obese without masses, hepatosplenomegaly or hernias noted.  Mild to moderate epigastric, RUQ and LUQ Tenderness.  No lower abdominal tenderness.  No guarding or rebound tenderness.    Neurologic:  Alert and oriented x3;  grossly normal neurologically.  Imaging Studies: See HPI.  Assessment and Plan:   Connie Romero is a 22 y.o. y/o female has been referred for:  1.  Bile salt diarrhea postcholecystectomy; chronic for 4 years  Start Colestid 1 g take 2 tablets once daily, #60, 1 refill.  2.  Epigastric pain; chronic; mild and intermittent  She needs an H. pylori breath test, however she started getting sick yesterday with acute upper respiratory symptoms.  She may have COVID.  Postpone H. pylori  breath test until she is well.  Wait on starting PPI until she has completed H. pylori breath test.    Recommend Lifestyle Modifications to prevent Acid Reflux.  Rec. Avoid coffee, sodas, peppermint, citrus fruits, and spicey foods.  Avoid eating 2-3 hours before bedtime.   3.  Irritable bowel syndrome  Continue dicyclomine as needed.  Follow up in 4 weeks with TG.  Celso Amy, PA-C

## 2023-05-21 ENCOUNTER — Other Ambulatory Visit: Payer: Self-pay

## 2023-05-21 DIAGNOSIS — R052 Subacute cough: Secondary | ICD-10-CM | POA: Insufficient documentation

## 2023-05-21 DIAGNOSIS — J3081 Allergic rhinitis due to animal (cat) (dog) hair and dander: Secondary | ICD-10-CM | POA: Insufficient documentation

## 2023-05-21 DIAGNOSIS — J301 Allergic rhinitis due to pollen: Secondary | ICD-10-CM | POA: Insufficient documentation

## 2023-05-21 DIAGNOSIS — J309 Allergic rhinitis, unspecified: Secondary | ICD-10-CM | POA: Insufficient documentation

## 2023-05-22 ENCOUNTER — Other Ambulatory Visit: Payer: Self-pay

## 2023-05-24 NOTE — Progress Notes (Deleted)
Celso Amy, PA-C [address]  [address]  [place], [place] [address]  Main: [phone]  Fax: [phone]   Primary Care Physician: [facility]  Primary Gastroenterologist:  ***  CC: Follow-up bile salt diarrhea, and IBS  HPI: Chai Cotrone is a 22 y.o. female returns for 1 month follow-up of bile salt diarrhea postcholecystectomy.  She has had chronic diarrhea for 4 years.  History of IBS and takes dicyclomine 20 Mg 3 times daily as needed.  Has had episodes of GERD and epigastric pain.  1 month ago was started on Colestid 1 g 2 tablets daily to help diarrhea.  She needs an H. pylori breath test.  Not currently on PPI or H2 RB.   Lab 03/04/2023 showed normal iron panel, normal CBC (hemoglobin 13.7), normal CMP except glucose 126.  A1c 5.9.  Elevated TSH 11.  HIV, HCV, and hepatitis B negative.   CT abdomen pelvis without contrast 02/2021: Cholecystectomy, no acute abnormality.   MRI abdomen and pelvis without contrast 11/2020 no abnormality to explain RLQ pain.  No appendicitis.  Mild hepatomegaly.  History of hepatic steatosis, PCOS.     Current Outpatient Medications  Medication Sig Dispense Refill   Cholestyramine POWD Take by mouth.     colestipol (COLESTID) 1 g tablet Take 2 tablets (2 g total) by mouth daily. 60 tablet 1   fluticasone (FLONASE) 50 MCG/ACT nasal spray 1-2 sprays each nostril nasally Once a day for 30 days     fluticasone-salmeterol (WIXELA INHUB) 250-50 MCG/ACT AEPB 1 puff Inhalation Twice a day for 30 days     levocetirizine (XYZAL) 5 MG tablet 1 tablet in the evening Orally Once a day for 30 days     levothyroxine (SYNTHROID) 75 MCG tablet Take 75 mcg by mouth daily.     Levothyroxine Sodium (SYNTHROID PO) Synthroid     Levothyroxine Sodium 50 MCG CAPS Take 50 mcg by mouth daily before breakfast.  11   metFORMIN (GLUCOPHAGE-XR) 500 MG 24 hr tablet Take 500 mg by mouth daily.     montelukast (SINGULAIR) 10 MG tablet 1 tablet Orally  Once a day for 30 days     VENTOLIN HFA 108 (90 Base) MCG/ACT inhaler Inhale 2 puffs into the lungs every 4 (four) hours as needed.     No current facility-administered medications for this visit.    Allergies as of 05/25/2023 - Review Complete 04/20/2023  Allergen Reaction Noted   Shellfish-derived products Nausea And Vomiting 11/04/2016    Past Medical History:  Diagnosis Date   Abdominal pain    Anxiety    Asthma    Cough    Diabetes mellitus without complication (HCC)    Fatty liver    Hypothyroid    Obesity    PCOS (polycystic ovarian syndrome)    Sleep apnea    Thyroid disease     Past Surgical History:  Procedure Laterality Date   CHOLECYSTECTOMY N/A 10/06/2018   Procedure: LAPAROSCOPIC CHOLECYSTECTOMY;  Surgeon: Carolan Shiver, MD;  Location: [facility] ORS;  Service: General;  Laterality: N/A;   hypothyroidisn     pcos     UVULOPALATOPHARYNGOPLASTY     WISDOM TOOTH EXTRACTION      Review of Systems:    All systems reviewed and negative except where noted in HPI.   Physical Examination:   There were no vitals taken for this visit.  General: Well-nourished, well-developed in no acute distress.  Lungs: Clear to auscultation bilaterally. Non-labored. Heart:  Regular rate and rhythm, no murmurs rubs or gallops.  Abdomen: Bowel sounds are normal; Abdomen is Soft; No hepatosplenomegaly, masses or hernias;  No Abdominal Tenderness; No guarding or rebound tenderness. Neuro: Alert and oriented x 3.  Grossly intact.  Psych: Alert and cooperative, normal mood and affect.   Imaging Studies: No results found.  Assessment and Plan:   Murphee Toupin is a 22 y.o. y/o female   1.  Bile salt diarrhea postcholecystectomy; chronic for 4 years  Continue Colestid  2.  Epigastric pain/GERD  H. pylori breath test  Start PPI/H2 RB  Continue avoiding GERD trigger foods and drinks  3.  Irritable bowel syndrome  Continue dicyclomine as needed  Low FODMAP diet  Fiber  supplement  Align probiotic daily   Celso Amy, PA-C  Follow up ***  BP check ***

## 2023-05-25 ENCOUNTER — Ambulatory Visit: Payer: Medicaid Other | Admitting: Physician Assistant
